# Patient Record
Sex: Male | Born: 1980
Health system: Southern US, Community
[De-identification: ages and names within clinical notes are randomized; demographics above are authoritative.]

---

## 2013-12-21 ENCOUNTER — Encounter: Payer: Self-pay | Admitting: Family Medicine

## 2013-12-21 ENCOUNTER — Encounter (INDEPENDENT_AMBULATORY_CARE_PROVIDER_SITE_OTHER): Payer: Self-pay

## 2013-12-21 ENCOUNTER — Ambulatory Visit (INDEPENDENT_AMBULATORY_CARE_PROVIDER_SITE_OTHER): Payer: Commercial Managed Care - PPO | Admitting: Family Medicine

## 2013-12-21 VITALS — BP 111/73 | HR 79 | Temp 97.3°F | Ht 66.0 in | Wt 259.8 lb

## 2013-12-21 DIAGNOSIS — L237 Allergic contact dermatitis due to plants, except food: Secondary | ICD-10-CM

## 2013-12-21 DIAGNOSIS — L255 Unspecified contact dermatitis due to plants, except food: Secondary | ICD-10-CM

## 2013-12-21 MED ORDER — METHYLPREDNISOLONE ACETATE 80 MG/ML IJ SUSP
80.0000 mg | Freq: Once | INTRAMUSCULAR | Status: AC
  Administered 2013-12-21: 80 mg via INTRAMUSCULAR

## 2013-12-21 MED ORDER — METHYLPREDNISOLONE (PAK) 4 MG PO TABS: ORAL_TABLET | ORAL | Status: DC

## 2013-12-21 NOTE — Progress Notes (Signed)
Subjective:    Patient ID: Stephen Johnson, male    DOB: [birth year], 33 y.o.   MRN: [id number]  HPI  This 33 y.o. male presents for evaluation of poison ivy rash on his arms and his hands for a week.  Review of Systems C/o rash No chest pain, SOB, HA, dizziness, vision change, N/V, diarrhea, constipation, dysuria, urinary urgency or frequency, myalgias, arthralgias.     Objective:   Physical Exam  Vital signs noted  Well developed well nourished male.  HEENT - Head atraumatic Normocephalic                Eyes - PERRLA, Conjuctiva - clear Sclera- Clear EOMI                Ears - EAC's Wnl TM's Wnl Gross Hearing WNL                Throat - oropharanx wnl Respiratory - Lungs CTA bilateral Cardiac - RRR S1 and S2 without murmur GI - Abdomen soft Nontender and bowel sounds active x 4 Extremities - No edema. Neuro - Grossly intact. Skin - Erythematous rash on hands and arms bilateral.     Assessment & Plan:  Poison oak dermatitis - Plan: methylPREDNISolone acetate (DEPO-MEDROL) injection 80 mg, methylPREDNIsolone (MEDROL DOSPACK) 4 MG tablet. Discussed using aveeno bath, and benadryl otc prn  Deatra Canter FNP

## 2013-12-21 NOTE — Patient Instructions (Signed)

## 2014-02-06 ENCOUNTER — Ambulatory Visit: Payer: Commercial Managed Care - PPO

## 2014-06-17 ENCOUNTER — Ambulatory Visit: Payer: Commercial Managed Care - PPO | Admitting: Family Medicine

## 2015-08-12 ENCOUNTER — Telehealth: Payer: Self-pay | Admitting: Family Medicine

## 2015-08-12 NOTE — Telephone Encounter (Signed)
Stp and refused to see anyone but [facility] and pt was advised that [facility] is only seeing his chronic follow up pts due to the fact that he will be going to part time in January. Pt states he will go somewhere else. Pt had not been seen in office in over a year and that was with Ander Slade not [facility].

## 2015-09-30 ENCOUNTER — Telehealth: Payer: Self-pay | Admitting: Family Medicine

## 2015-09-30 NOTE — Telephone Encounter (Signed)
Appointment made with Dr. Ermalinda Memos for Thursday at 11:10 for an annual physical.  Needs referral to rheumatology

## 2015-10-02 ENCOUNTER — Encounter: Payer: Self-pay | Admitting: Family Medicine

## 2015-10-02 ENCOUNTER — Ambulatory Visit (INDEPENDENT_AMBULATORY_CARE_PROVIDER_SITE_OTHER): Payer: 59 | Admitting: Family Medicine

## 2015-10-02 VITALS — BP 117/80 | HR 64 | Temp 97.1°F | Ht 66.0 in | Wt 263.4 lb

## 2015-10-02 DIAGNOSIS — Z0000 Encounter for general adult medical examination without abnormal findings: Secondary | ICD-10-CM | POA: Diagnosis not present

## 2015-10-02 DIAGNOSIS — M109 Gout, unspecified: Secondary | ICD-10-CM | POA: Diagnosis not present

## 2015-10-02 NOTE — Progress Notes (Signed)
   HPI  Patient presents today here for referral to rheumatologist an annual physical.   Gout He states that about a month ago he a red painful right ankle and was seen at a podiatrist. Podiatrist check his uric acid level which was 4 and gave him cortisone injections which helped his pain a lot. He told that he had gout. He is a friend who works at [id number] rheumatology and would like to be referred there for gout management. He works as an Public relations account executive.  He does not exercise, he does not watch his diet. He is a nonsmoker  PMH: Smoking status noted Past medical, surgical, family, and social history reviewed and updated in EMR ROS: Per HPI  Objective: BP 117/80 mmHg  Pulse 64  Temp(Src) 97.1 F (36.2 C) (Oral)  Ht $R'5\' 6"'QO$  (1.676 m)  Wt 263 lb 6.4 oz (119.477 kg)  BMI 42.53 kg/m2 Gen: NAD, alert, cooperative with exam HEENT: NCAT, sclera white, TMs normal bilaterally, nares clear, oropharynx clear CV: RRR, good S1/S2, no murmur Resp: CTABL, no wheezes, non-labored Abd: SNTND, BS present, no guarding or organomegaly Ext: No edema, warm Neuro: Alert and oriented, No gross deficits  Assessment and plan:  # Gout Refer to rheumatology per his request Discussed usual treatment, also checking uric acid per his request Return to clinic if needed for gout flares  # Annual physical Discussed positive last changes Fasting labs He has had his flu shot   Orders Placed This Encounter  Procedures  . CMP14+EGFR  . CBC  . Lipid Panel  . Uric acid  . Ambulatory referral to Rheumatology    Referral Priority:  Routine    Referral Type:  Consultation    Referral Reason:  Specialty Services Required    Requested Specialty:  Rheumatology    Number of Visits Requested:  [place], MD [facility] 10/02/2015, 11:48 AM

## 2015-10-02 NOTE — Patient Instructions (Signed)
Great to meet you! We will have your labs back to you within 1 week.   We will also work on your referral.   Gout Gout is when your joints become red, sore, and swell (inflamed). This is caused by the buildup of uric acid crystals in the joints. Uric acid is a chemical that is normally in the blood. If the level of uric acid gets too high in the blood, these crystals form in your joints and tissues. Over time, these crystals can form into masses near the joints and tissues. These masses can destroy bone and cause the bone to look misshapen (deformed). HOME CARE   Do not take aspirin for pain.  Only take medicine as told by your doctor.  Rest the joint as much as you can. When in bed, keep sheets and blankets off painful areas.  Keep the sore joints raised (elevated).  Put warm or cold packs on painful joints. Use of warm or cold packs depends on which works best for you.  Use crutches if the painful joint is in your leg.  Drink enough fluids to keep your pee (urine) clear or pale yellow. Limit alcohol, sugary drinks, and drinks with fructose in them.  Follow your diet instructions. Pay careful attention to how much protein you eat. Include fruits, vegetables, whole grains, and fat-free or low-fat milk products in your daily diet. Talk to your doctor or dietitian about the use of coffee, vitamin C, and cherries. These may help lower uric acid levels.  Keep a healthy body weight. GET HELP RIGHT AWAY IF:   You have watery poop (diarrhea), throw up (vomit), or have any side effects from medicines.  You do not feel better in 24 hours, or you are getting worse.  Your joint becomes suddenly more tender, and you have chills or a fever. MAKE SURE YOU:   Understand these instructions.  Will watch your condition.  Will get help right away if you are not doing well or get worse.   This information is not intended to replace advice given to you by your health care provider. Make sure you  discuss any questions you have with your health care provider.   Document Released: 09/14/2008 Document Revised: 12/27/2014 Document Reviewed: 07/19/2012 Elsevier Interactive Patient Education [employer]2016 Elsevier Inc.

## 2015-10-03 LAB — CMP14+EGFR
ALBUMIN: 4.8 g/dL (ref 3.5–5.5)
ALK PHOS: 105 IU/L (ref 39–117)
ALT: 52 IU/L — ABNORMAL HIGH (ref 0–44)
AST: 32 IU/L (ref 0–40)
Albumin/Globulin Ratio: 1.9 (ref 1.1–2.5)
BUN / CREAT RATIO: 10 (ref 8–19)
BUN: 11 mg/dL (ref 6–20)
Bilirubin Total: 0.5 mg/dL (ref 0.0–1.2)
CO2: 23 mmol/L (ref 18–29)
CREATININE: 1.1 mg/dL (ref 0.76–1.27)
Calcium: 9.5 mg/dL (ref 8.7–10.2)
Chloride: 101 mmol/L (ref 97–108)
GFR calc non Af Amer: 87 mL/min/{1.73_m2} (ref >59)
GFR, EST AFRICAN AMERICAN: 101 mL/min/{1.73_m2} (ref >59)
GLOBULIN, TOTAL: 2.5 g/dL (ref 1.5–4.5)
Glucose: 95 mg/dL (ref 65–99)
Potassium: 4.5 mmol/L (ref 3.5–5.2)
SODIUM: 141 mmol/L (ref 134–144)
TOTAL PROTEIN: 7.3 g/dL (ref 6.0–8.5)

## 2015-10-03 LAB — CBC
Hematocrit: 45.2 % (ref 37.5–51.0)
Hemoglobin: 15.8 g/dL (ref 12.6–17.7)
MCH: 30.5 pg (ref 26.6–33.0)
MCHC: 35 g/dL (ref 31.5–35.7)
MCV: 87 fL (ref 79–97)
PLATELETS: 220 10*3/uL (ref 150–379)
RBC: 5.18 x10E6/uL (ref 4.14–5.80)
RDW: 14.3 % (ref 12.3–15.4)
WBC: 7.6 10*3/uL (ref 3.4–10.8)

## 2015-10-03 LAB — LIPID PANEL
Chol/HDL Ratio: 5.3 ratio units — ABNORMAL HIGH (ref 0.0–5.0)
Cholesterol, Total: 185 mg/dL (ref 100–199)
HDL: 35 mg/dL — ABNORMAL LOW (ref >39)
LDL Calculated: 115 mg/dL — ABNORMAL HIGH (ref 0–99)
Triglycerides: 173 mg/dL — ABNORMAL HIGH (ref 0–149)
VLDL Cholesterol Cal: 35 mg/dL (ref 5–40)

## 2015-10-03 LAB — URIC ACID: URIC ACID: 8.8 mg/dL — AB (ref 3.7–8.6)

## 2016-03-11 ENCOUNTER — Telehealth: Payer: 59 | Admitting: Physician Assistant

## 2016-03-11 DIAGNOSIS — B9689 Other specified bacterial agents as the cause of diseases classified elsewhere: Secondary | ICD-10-CM

## 2016-03-11 DIAGNOSIS — J019 Acute sinusitis, unspecified: Secondary | ICD-10-CM | POA: Diagnosis not present

## 2016-03-11 MED ORDER — DOXYCYCLINE HYCLATE 100 MG PO CAPS: 100.0000 mg | ORAL_CAPSULE | Freq: Two times a day (BID) | ORAL | Status: DC

## 2016-03-11 MED FILL — DOXYCYCLINE HYCLATE 100 MG: 100 | 10 days supply | Qty: 20 | Fill #0

## 2016-03-11 NOTE — Progress Notes (Signed)

## 2016-05-13 ENCOUNTER — Telehealth: Payer: 59 | Admitting: Physician Assistant

## 2016-05-13 DIAGNOSIS — L255 Unspecified contact dermatitis due to plants, except food: Secondary | ICD-10-CM | POA: Diagnosis not present

## 2016-05-13 MED ORDER — PREDNISONE 10 MG (21) PO TBPK: 10.0000 mg | ORAL_TABLET | Freq: Every day | ORAL | Status: DC

## 2016-05-13 MED FILL — predniSONE 10 MG TABS: 10 | 6 days supply | Qty: 21 | Fill #0

## 2016-05-13 NOTE — Progress Notes (Signed)

## 2016-06-30 ENCOUNTER — Emergency Department (HOSPITAL_COMMUNITY): Payer: 59

## 2016-06-30 ENCOUNTER — Emergency Department (HOSPITAL_COMMUNITY)
Admission: EM | Admit: 2016-06-30 | Discharge: 2016-06-30 | Disposition: A | Discharge status: Home / Self Care | Payer: 59 | Attending: Emergency Medicine | Admitting: Emergency Medicine

## 2016-06-30 ENCOUNTER — Encounter (HOSPITAL_COMMUNITY): Payer: Self-pay | Admitting: Nurse Practitioner

## 2016-06-30 DIAGNOSIS — N1330 Unspecified hydronephrosis: Secondary | ICD-10-CM | POA: Diagnosis not present

## 2016-06-30 DIAGNOSIS — N132 Hydronephrosis with renal and ureteral calculous obstruction: Secondary | ICD-10-CM | POA: Insufficient documentation

## 2016-06-30 DIAGNOSIS — R0602 Shortness of breath: Secondary | ICD-10-CM | POA: Diagnosis not present

## 2016-06-30 DIAGNOSIS — N200 Calculus of kidney: Secondary | ICD-10-CM

## 2016-06-30 DIAGNOSIS — R109 Unspecified abdominal pain: Secondary | ICD-10-CM | POA: Diagnosis not present

## 2016-06-30 DIAGNOSIS — N202 Calculus of kidney with calculus of ureter: Secondary | ICD-10-CM | POA: Diagnosis not present

## 2016-06-30 DIAGNOSIS — R1011 Right upper quadrant pain: Secondary | ICD-10-CM | POA: Diagnosis present

## 2016-06-30 LAB — COMPREHENSIVE METABOLIC PANEL
ALBUMIN: 4.6 g/dL (ref 3.5–5.0)
ALT: 70 U/L — ABNORMAL HIGH (ref 17–63)
AST: 46 U/L — AB (ref 15–41)
Alkaline Phosphatase: 91 U/L (ref 38–126)
Anion gap: 11 (ref 5–15)
BUN: 9 mg/dL (ref 6–20)
CHLORIDE: 106 mmol/L (ref 101–111)
CO2: 20 mmol/L — AB (ref 22–32)
Calcium: 9.3 mg/dL (ref 8.9–10.3)
Creatinine, Ser: 1.38 mg/dL — ABNORMAL HIGH (ref 0.61–1.24)
GFR calc Af Amer: >60 mL/min (ref >60)
GFR calc non Af Amer: >60 mL/min (ref >60)
GLUCOSE: 123 mg/dL — AB (ref 65–99)
POTASSIUM: 3.4 mmol/L — AB (ref 3.5–5.1)
Sodium: 137 mmol/L (ref 135–145)
Total Bilirubin: 1 mg/dL (ref 0.3–1.2)
Total Protein: 7.4 g/dL (ref 6.5–8.1)

## 2016-06-30 LAB — URINALYSIS, ROUTINE W REFLEX MICROSCOPIC
Bilirubin Urine: NEGATIVE
GLUCOSE, UA: NEGATIVE mg/dL
Hgb urine dipstick: NEGATIVE
KETONES UR: NEGATIVE mg/dL
LEUKOCYTES UA: NEGATIVE
Nitrite: NEGATIVE
PH: 5.5 (ref 5.0–8.0)
Protein, ur: NEGATIVE mg/dL
Specific Gravity, Urine: 1.023 (ref 1.005–1.030)

## 2016-06-30 LAB — CBC
HEMATOCRIT: 45.6 % (ref 39.0–52.0)
Hemoglobin: 16.2 g/dL (ref 13.0–17.0)
MCH: 30.2 pg (ref 26.0–34.0)
MCHC: 35.5 g/dL (ref 30.0–36.0)
MCV: 85.1 fL (ref 78.0–100.0)
Platelets: 282 10*3/uL (ref 150–400)
RBC: 5.36 MIL/uL (ref 4.22–5.81)
RDW: 13.1 % (ref 11.5–15.5)
WBC: 11.3 10*3/uL — AB (ref 4.0–10.5)

## 2016-06-30 LAB — LIPASE, BLOOD: Lipase: 28 U/L (ref 11–51)

## 2016-06-30 MED ORDER — PROMETHAZINE HCL 25 MG/ML IJ SOLN
25.0000 mg | Freq: Once | INTRAMUSCULAR | Status: AC
  Administered 2016-06-30: 25 mg via INTRAVENOUS
  Filled 2016-06-30: qty 1

## 2016-06-30 MED ORDER — FENTANYL CITRATE (PF) 100 MCG/2ML IJ SOLN
INTRAMUSCULAR | Status: AC
  Filled 2016-06-30: qty 2

## 2016-06-30 MED ORDER — ONDANSETRON 4 MG PO TBDP
4.0000 mg | ORAL_TABLET | Freq: Once | ORAL | Status: AC | PRN
  Administered 2016-06-30: 4 mg via ORAL
  Filled 2016-06-30: qty 1

## 2016-06-30 MED ORDER — HYDROMORPHONE HCL 1 MG/ML IJ SOLN
1.0000 mg | Freq: Once | INTRAMUSCULAR | Status: DC
  Filled 2016-06-30: qty 1

## 2016-06-30 MED ORDER — HYDROMORPHONE HCL 1 MG/ML IJ SOLN
INTRAMUSCULAR | Status: AC
  Administered 2016-06-30: 1 mg via INTRAVENOUS
  Filled 2016-06-30: qty 1

## 2016-06-30 MED ORDER — KETOROLAC TROMETHAMINE 15 MG/ML IJ SOLN
15.0000 mg | Freq: Once | INTRAMUSCULAR | Status: AC
  Administered 2016-06-30: 15 mg via INTRAVENOUS
  Filled 2016-06-30: qty 1

## 2016-06-30 MED ORDER — HYDROMORPHONE HCL 1 MG/ML IJ SOLN
1.0000 mg | Freq: Once | INTRAMUSCULAR | Status: AC
  Administered 2016-06-30: 1 mg via INTRAVENOUS

## 2016-06-30 MED ORDER — FENTANYL CITRATE (PF) 100 MCG/2ML IJ SOLN
50.0000 ug | INTRAMUSCULAR | Status: AC | PRN
  Administered 2016-06-30 (×2): 50 ug via INTRAVENOUS

## 2016-06-30 MED ORDER — HYDROCODONE-ACETAMINOPHEN 5-325 MG PO TABS: 2.0000 | ORAL_TABLET | ORAL | Status: DC | PRN

## 2016-06-30 MED ORDER — HYDROMORPHONE HCL 1 MG/ML IJ SOLN
1.0000 mg | Freq: Once | INTRAMUSCULAR | Status: DC
  Administered 2016-06-30: 1 mg via INTRAVENOUS

## 2016-06-30 MED ORDER — HYDROMORPHONE HCL 1 MG/ML IJ SOLN: 1.0000 mg | Freq: Once | INTRAMUSCULAR | Status: DC

## 2016-06-30 MED ORDER — NAPROXEN 500 MG PO TABS: 500.0000 mg | ORAL_TABLET | Freq: Two times a day (BID) | ORAL | Status: DC

## 2016-06-30 MED ORDER — PROMETHAZINE HCL 25 MG PO TABS: 25.0000 mg | ORAL_TABLET | Freq: Four times a day (QID) | ORAL | Status: DC | PRN

## 2016-06-30 MED ORDER — TAMSULOSIN HCL 0.4 MG PO CAPS: 0.4000 mg | ORAL_CAPSULE | Freq: Every day | ORAL | Status: DC

## 2016-06-30 MED ORDER — MORPHINE SULFATE (PF) 10 MG/ML IV SOLN
10.0000 mg | Freq: Once | INTRAVENOUS | Status: AC
  Administered 2016-06-30: 10 mg via INTRAVENOUS
  Filled 2016-06-30: qty 1

## 2016-06-30 MED ORDER — FENTANYL CITRATE (PF) 100 MCG/2ML IJ SOLN
INTRAMUSCULAR | Status: AC
  Administered 2016-06-30: 50 ug via INTRAVENOUS
  Filled 2016-06-30: qty 2

## 2016-06-30 MED ORDER — ONDANSETRON HCL 4 MG/2ML IJ SOLN
4.0000 mg | Freq: Once | INTRAMUSCULAR | Status: AC
  Administered 2016-06-30: 4 mg via INTRAVENOUS
  Filled 2016-06-30: qty 2

## 2016-06-30 NOTE — ED Provider Notes (Signed)
CSN: [id number]     Arrival date & time 06/30/16  1658 History   First MD Initiated Contact with Patient 06/30/16 1742     Chief Complaint  Patient presents with  . Abdominal Pain     (Consider location/radiation/quality/duration/timing/severity/associated sxs/prior Treatment) HPI Aching abd pain for 2 days then became sharp, severe in last hr.  Right upper quadrant, right side pain.  Nothing makes it better or worse.  Nausea, vomiting from pain. No other symptoms.  History reviewed. No pertinent past medical history. History reviewed. No pertinent past surgical history. History reviewed. No pertinent family history. Social History  Substance Use Topics  . Smoking status: Never Smoker   . Smokeless tobacco: None  . Alcohol Use: No    Review of Systems  Constitutional: Negative for fever.  HENT: Negative for sore throat.   Eyes: Negative for visual disturbance.  Respiratory: Negative for shortness of breath.   Cardiovascular: Negative for chest pain.  Gastrointestinal: Positive for nausea, vomiting and abdominal pain. Negative for diarrhea and constipation.  Genitourinary: Positive for flank pain. Negative for difficulty urinating.  Musculoskeletal: Negative for back pain and neck stiffness.  Skin: Negative for rash.  Neurological: Negative for syncope and headaches.      Allergies  Penicillins  Home Medications   Prior to Admission medications   Medication Sig Start Date End Date Taking? Authorizing Provider  doxycycline (VIBRAMYCIN) 100 MG capsule Take 1 capsule (100 mg total) by mouth 2 (two) times daily. Patient not taking: Reported on 06/30/2016 03/11/16   Waldon Merl, PA-C  HYDROcodone-acetaminophen (NORCO/VICODIN) 5-325 MG tablet Take 2 tablets by mouth every 4 (four) hours as needed. 06/30/16   Alvira Monday, MD  naproxen (NAPROSYN) 500 MG tablet Take 1 tablet (500 mg total) by mouth 2 (two) times daily. 06/30/16   Alvira Monday, MD  predniSONE (STERAPRED  UNI-PAK 21 TAB) 10 MG (21) TBPK tablet Take 1 tablet (10 mg total) by mouth daily. Take following package directions. Patient not taking: Reported on 06/30/2016 05/13/16   Waldon Merl, PA-C  promethazine (PHENERGAN) 25 MG tablet Take 1 tablet (25 mg total) by mouth every 6 (six) hours as needed for nausea or vomiting. 06/30/16   Alvira Monday, MD  tamsulosin (FLOMAX) 0.4 MG CAPS capsule Take 1 capsule (0.4 mg total) by mouth daily. 06/30/16   Alvira Monday, MD   BP 141/96 mmHg  Pulse 81  Temp(Src) 98.6 F (37 C) (Oral)  Resp 20  SpO2 93% Physical Exam  Constitutional: He is oriented to person, place, and time. He appears well-developed and well-nourished. No distress.  HENT:  Head: Normocephalic and atraumatic.  Eyes: Conjunctivae and EOM are normal.  Neck: Normal range of motion.  Cardiovascular: Normal rate, regular rhythm, normal heart sounds and intact distal pulses.  Exam reveals no gallop and no friction rub.   No murmur heard. Pulmonary/Chest: Effort normal and breath sounds normal. No respiratory distress. He has no wheezes. He has no rales.  Abdominal: Soft. He exhibits no distension. There is tenderness (RUQ). There is guarding (RUQ), CVA tenderness (R) and positive Murphy's sign.  Musculoskeletal: He exhibits no edema.  Neurological: He is alert and oriented to person, place, and time.  Skin: Skin is warm and dry. He is not diaphoretic.  Nursing note and vitals reviewed.   ED Course  Procedures (including critical care time) Labs Review Labs Reviewed  COMPREHENSIVE METABOLIC PANEL - Abnormal; Notable for the following:    Potassium 3.4 (*)  CO2 20 (*)    Glucose, Bld 123 (*)    Creatinine, Ser 1.38 (*)    AST 46 (*)    ALT 70 (*)    All other components within normal limits  CBC - Abnormal; Notable for the following:    WBC 11.3 (*)    All other components within normal limits  URINALYSIS, ROUTINE W REFLEX MICROSCOPIC (NOT AT [facility])  LIPASE, BLOOD     Imaging Review Dg Chest Portable 1 View  06/30/2016  CLINICAL DATA:  Generalized abdominal pain and shortness of breath for 4 days. EXAM: PORTABLE CHEST 1 VIEW COMPARISON:  No comparison studies available. FINDINGS: 1928 hours. Low lung volumes. The lungs are clear wiithout focal pneumonia, edema, pneumothorax or pleural effusion. Cardiopericardial silhouette is at upper limits of normal for size. The visualized bony structures of the thorax are intact. IMPRESSION: No active disease. Electronically Signed   By: Kennith Center M.D.   On: 06/30/2016 19:44   Dg Abd Portable 1v  06/30/2016  CLINICAL DATA:  Abdominal pain and shortness of breath for 4 days. Increased symptoms over the last several hours. EXAM: PORTABLE ABDOMEN - 1 VIEW COMPARISON:  None. FINDINGS: The bowel gas pattern is normal. No radio-opaque calculi or other significant radiographic abnormality are seen. IMPRESSION: Negative two view abdomen. Electronically Signed   By: Marin Roberts M.D.   On: 06/30/2016 19:44   Ct Renal Stone Study  06/30/2016  CLINICAL DATA:  Right-sided flank and right lower quadrant pain for 2 days. EXAM: CT ABDOMEN AND PELVIS WITHOUT CONTRAST TECHNIQUE: Multidetector CT imaging of the abdomen and pelvis was performed following the standard protocol without IV contrast. COMPARISON:  Abdominal radiographs from the same day. FINDINGS: Lower chest: Mild dependent atelectasis is present at the lung bases bilaterally without other focal airspace disease, nodule, or mass lesion. The heart size is normal. No significant pleural or pericardial effusion is present. Hepatobiliary: There is diffuse fatty infiltration of the liver. No significant nodularity is present. No mass lesion is present. The common bile duct and gallbladder are within normal limits. Pancreas: The pancreas is unremarkable. Spleen: The spleen is within normal limits. Adrenals/Urinary Tract: Adrenal glands are normal bilaterally. Mild right-sided  hydronephrosis is present. The right ureter is dilated into the level of the right UVJ where a 2.5 mm stone is obstructing. The left kidney and ureter are within normal limits. No additional stones are present. The urinary bladder is within normal limits otherwise. Stomach/Bowel: The stomach and duodenum are within normal limits. Small bowel is unremarkable. The appendix is visualized and normal. The ascending and transverse colon are within normal limits. The descending and rectosigmoid colon are unremarkable. Vascular/Lymphatic: No significant vascular calcifications are present. There is no significant adenopathy. Reproductive: Unremarkable Other: No significant free fluid is present. There are no significant hernias. Musculoskeletal: Bone windows demonstrate no focal lytic or blastic lesions. Vertebral body heights and alignment are maintained. IMPRESSION: 1. Mild right-sided hydronephrosis and ureteral dilation with an obstructing 2.5 mm stone at the right UVJ. 2. No other stones or left-sided hydronephrosis. 3. Hepatic steatosis. Electronically Signed   By: Marin Roberts M.D.   On: 06/30/2016 20:55   I have personally reviewed and evaluated these images and lab results as part of my medical decision-making.   EKG Interpretation None      MDM   Final diagnoses:  Right nephrolithiasis  Hydronephrosis, right   35yo male with no significant medical history presents with concern for right upper quadrant  and flank pain. Patient with intractable pain on arrival, with ddx including nephrolithiasis, cholecystitis, perforation. Portable XR without abnormalities.  Given fentanyl, dilaudid, morphine, phenergan with improvement of symptoms. CT stone study shows 2.665mm obstructing nephrolithiasis.  Given toradol. Discharged with rx for flomax, norco, naproxen and phenergan. Patient discharged in stable condition with understanding of reasons to return.   Alvira MondayErin Elvin Mccartin, MD 07/01/16 1819

## 2016-06-30 NOTE — ED Notes (Signed)
Contacted CT in order to get pt in quickly now that he's more settled.

## 2016-06-30 NOTE — ED Notes (Signed)
Pt departed in NAD.  

## 2016-06-30 NOTE — ED Notes (Signed)
MD made aware of pt. Persistent pain, ok to give 50 mcg fentanyl verbal order.

## 2016-06-30 NOTE — ED Notes (Signed)
Ashleigh, RN went to speak with MD about pain medicine for patient, will attempt bp again; patient still pacing

## 2016-06-30 NOTE — ED Notes (Signed)
Pt c/o 2 day history dull RLQ abd pain. In past hour developed severe pain, diaphoretic, n/v, Pt had to pull his car over and call family for ride. Pt is unable to sit still, remains diaphoretic

## 2016-06-30 NOTE — ED Notes (Signed)
Patient transported to CT 

## 2016-07-31 IMAGING — CR DG CHEST 1V PORT
1 series · 1 of 1 positions shown · non-contrast
Comparison: No comparison studies available.

CLINICAL DATA: Generalized abdominal pain and shortness of breath
for 4 days.

EXAM:
PORTABLE CHEST 1 VIEW

[AP]
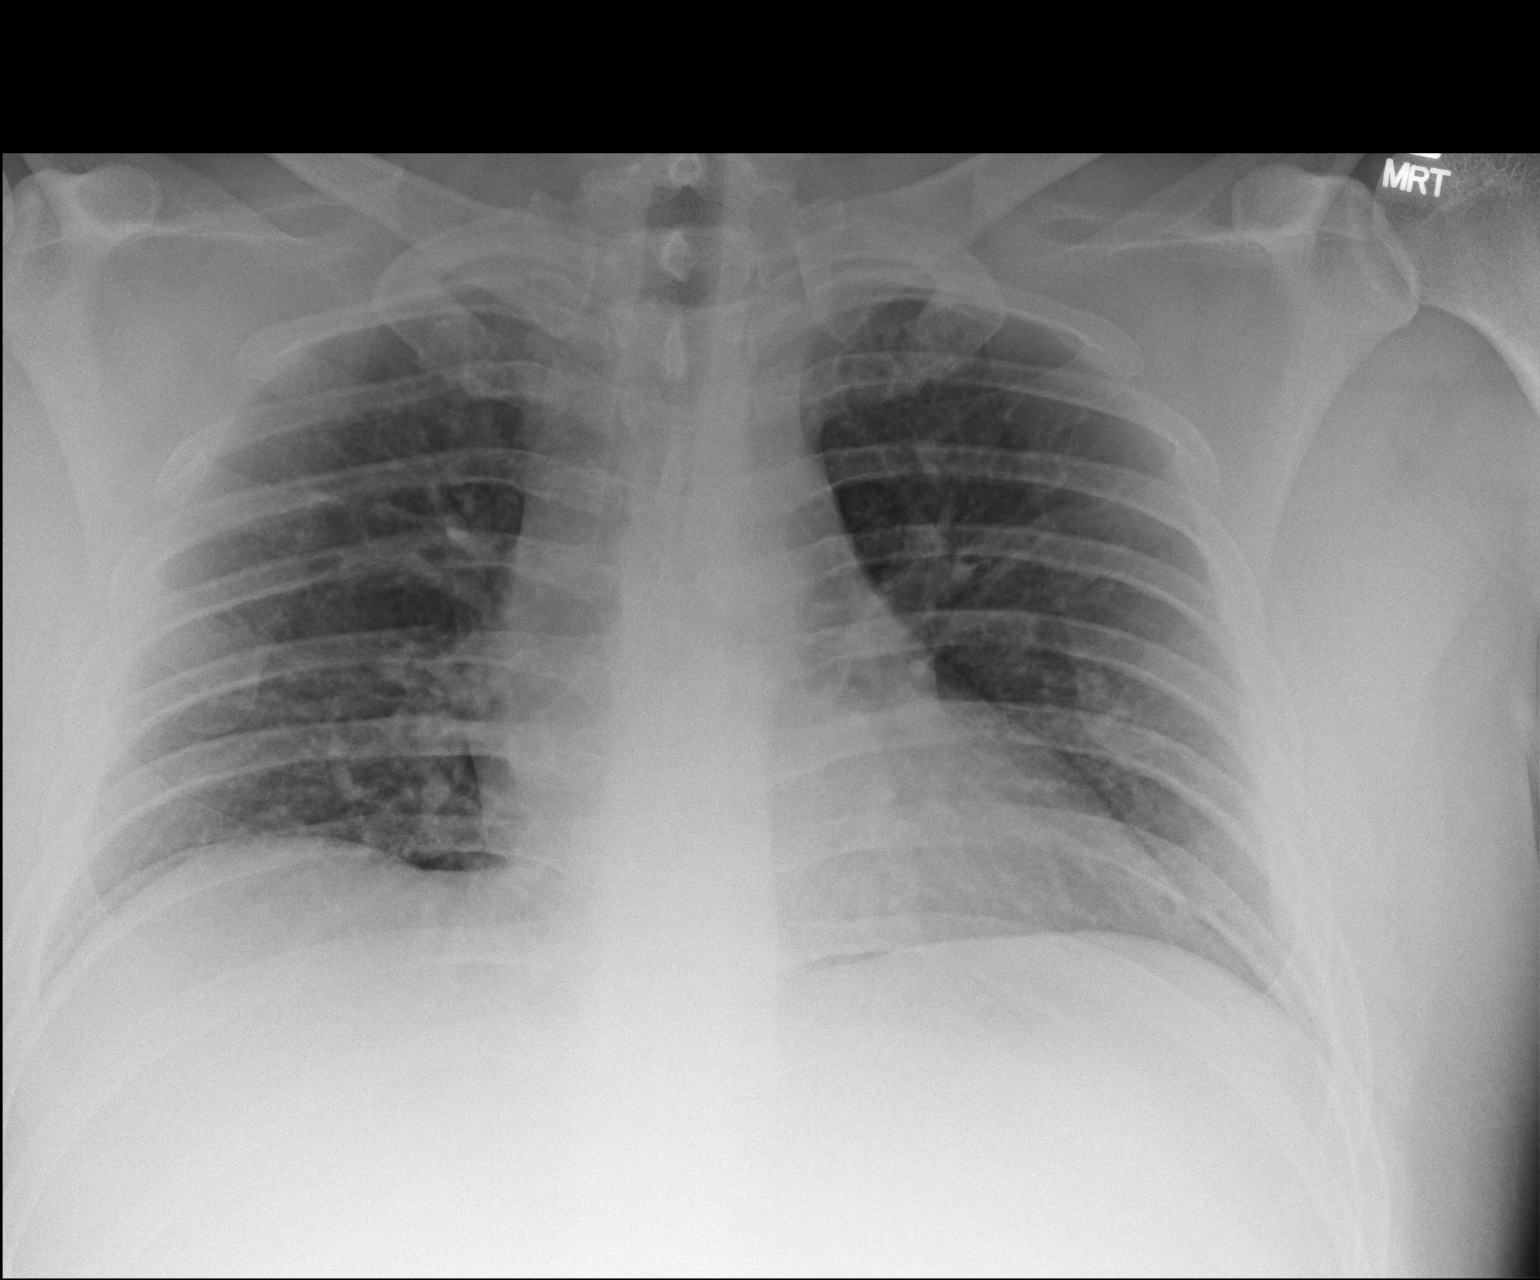

[1 of 1 positions shown; findings below may reference images not displayed]

FINDINGS: 2620 hours. Low lung volumes. The lungs are clear wiithout focal
pneumonia, edema, pneumothorax or pleural effusion.
Cardiopericardial silhouette is at upper limits of normal for size.
The visualized bony structures of the thorax are intact.
IMPRESSION: No active disease.

## 2016-07-31 IMAGING — CR DG ABD PORTABLE 1V
2 series · 2 of 2 positions shown · non-contrast
Comparison: None.

CLINICAL DATA: Abdominal pain and shortness of breath for 4 days.
Increased symptoms over the last several hours.

EXAM:
PORTABLE ABDOMEN - 1 VIEW

[AP (1 of 2)]
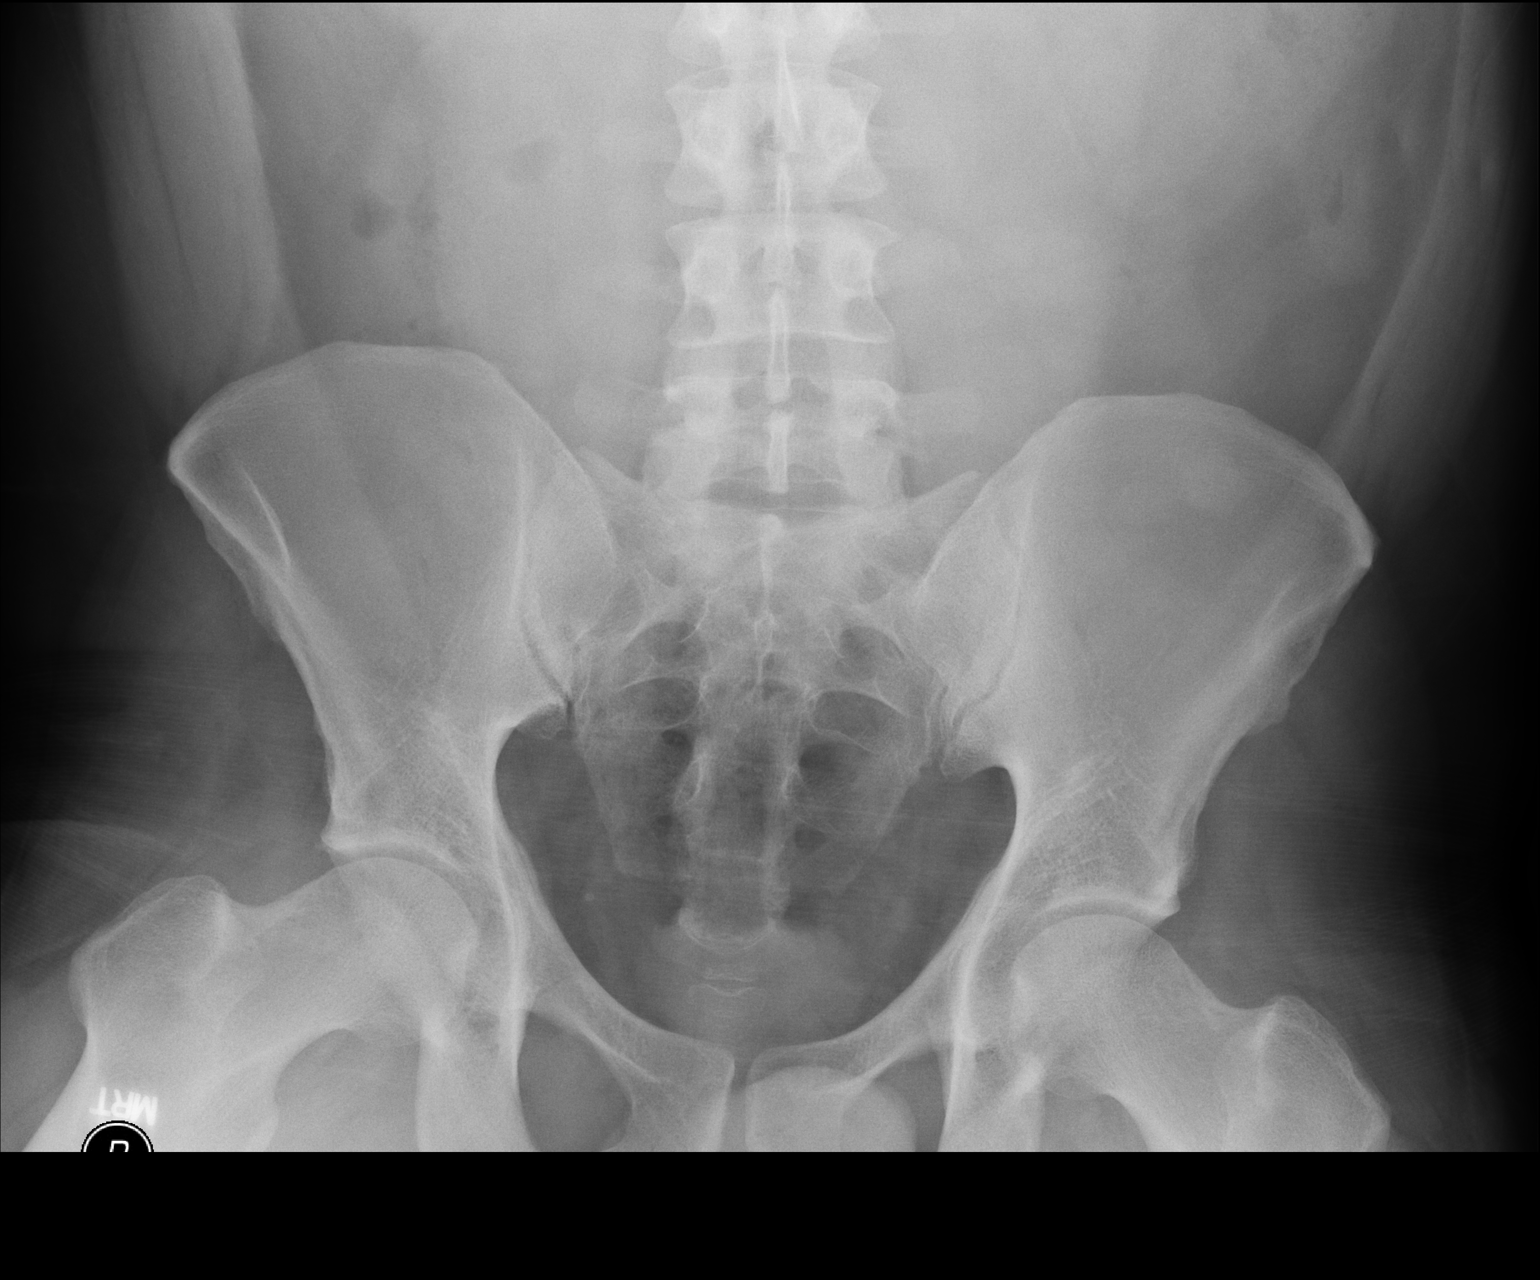

[AP (2 of 2)]
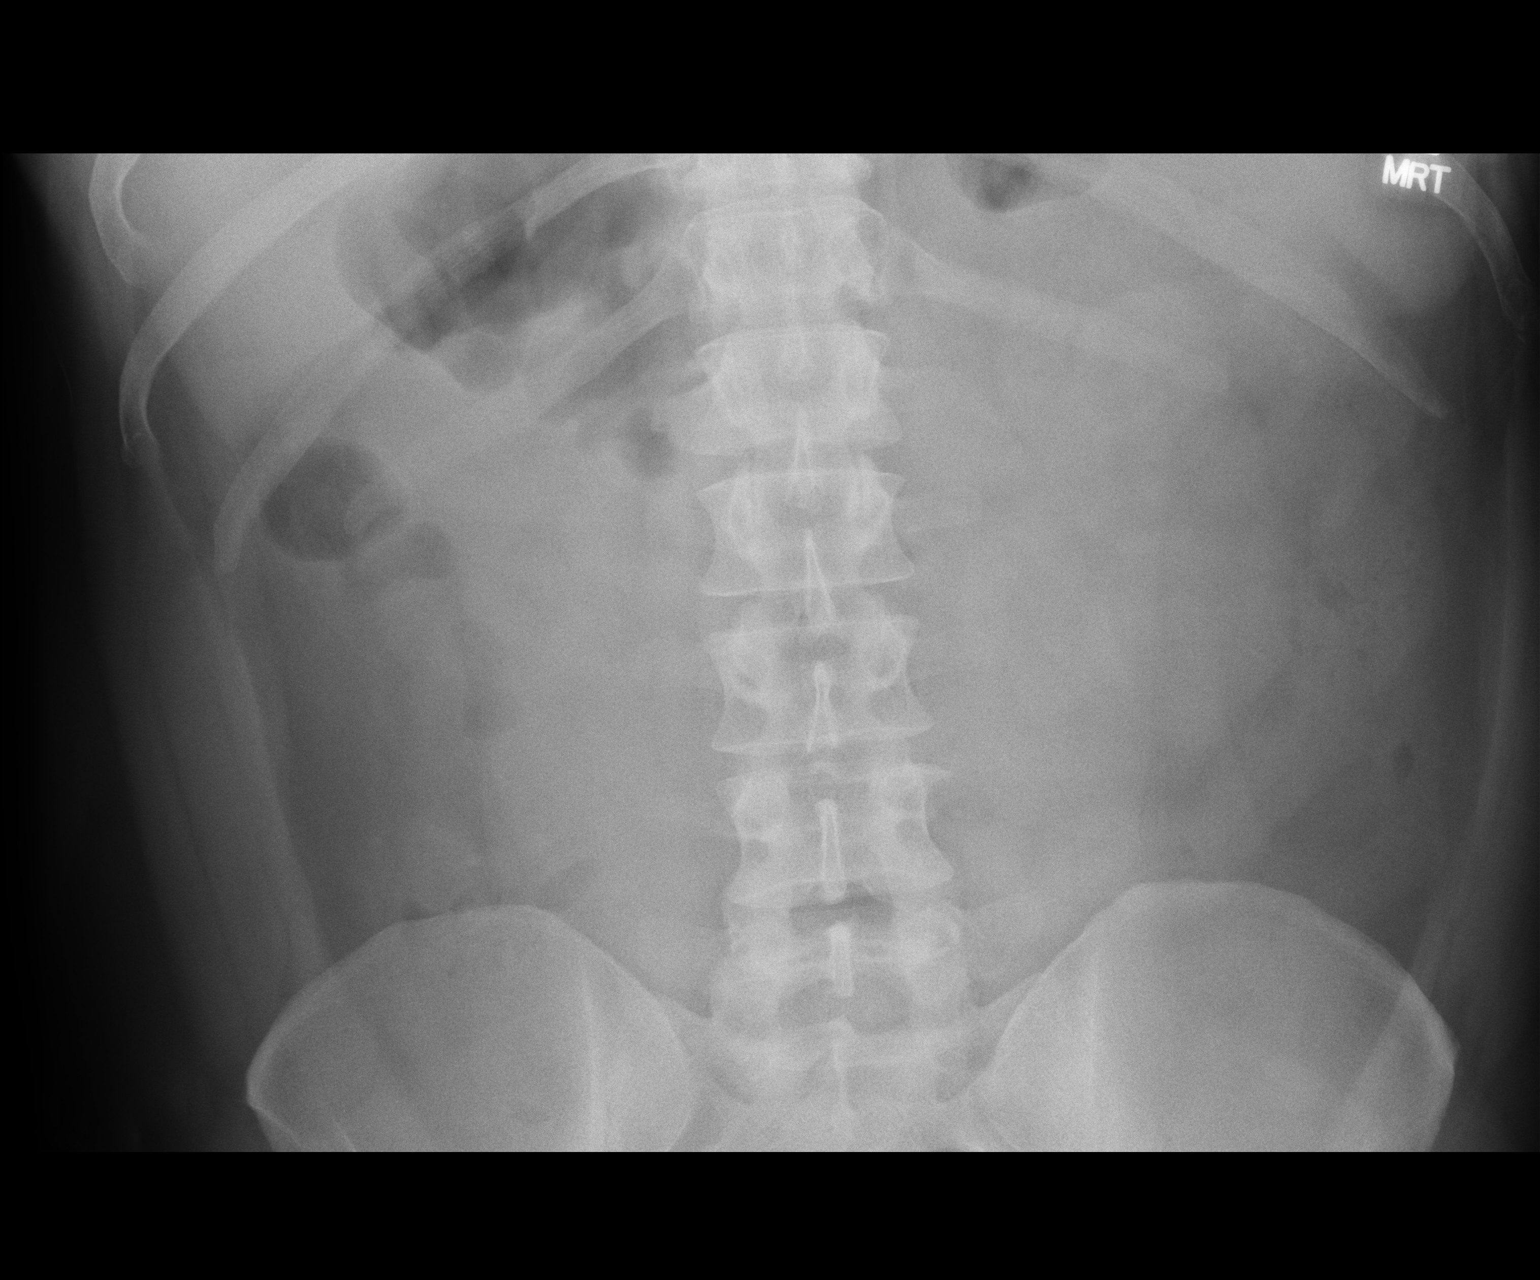

[2 of 2 positions shown; findings below may reference images not displayed]

FINDINGS: The bowel gas pattern is normal. No radio-opaque calculi or other
significant radiographic abnormality are seen.
IMPRESSION: Negative two view abdomen.

## 2016-07-31 IMAGING — CT CT RENAL STONE PROTOCOL
2 of 4 series · 16 of 46 positions shown, 18 images · non-contrast
Comparison: Abdominal radiographs from the same day.

CLINICAL DATA: Right-sided flank and right lower quadrant pain for
2 days.

EXAM:
CT ABDOMEN AND PELVIS WITHOUT CONTRAST
TECHNIQUE: Multidetector CT imaging of the abdomen and pelvis was performed
following the standard protocol without IV contrast.

[Series 2: renal stone 5mm · axial · 0.81mm/px · z∈[+854,+1359]mm · 13 of 111 slices shown, 15 images]
[im 5/111  soft-tissue]
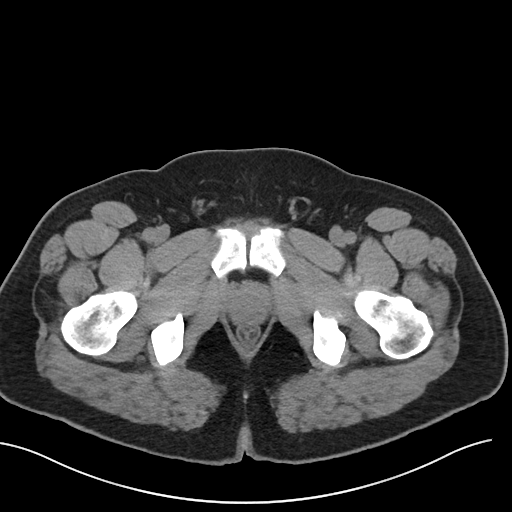
[im 5/111  bone]
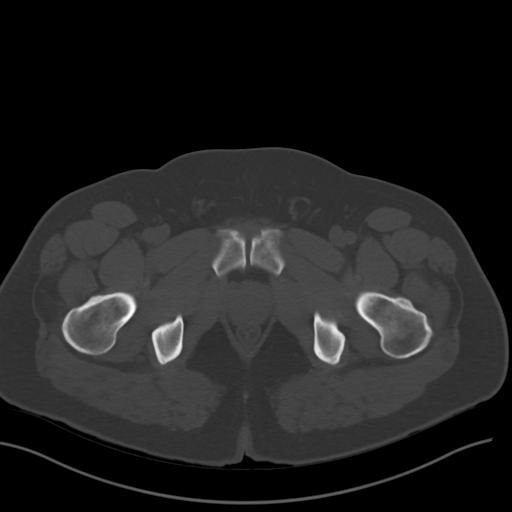
[im 14/111  soft-tissue]
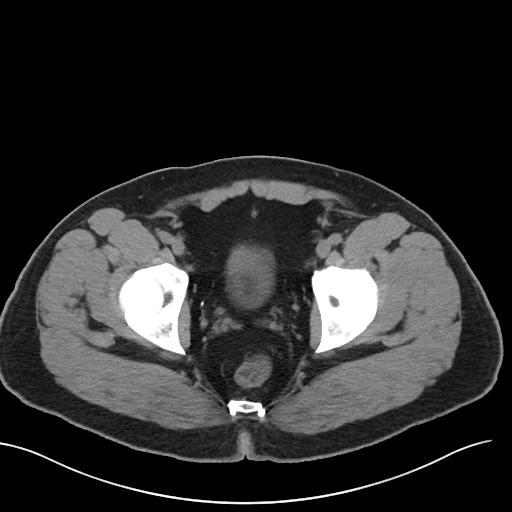
[im 23/111  soft-tissue]
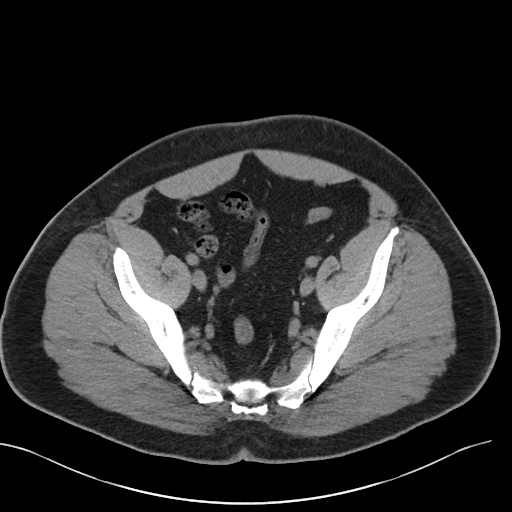
[im 33/111  soft-tissue]
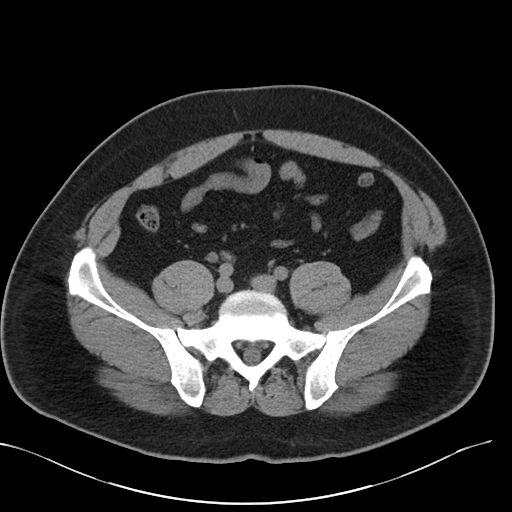
[im 37/111  soft-tissue]
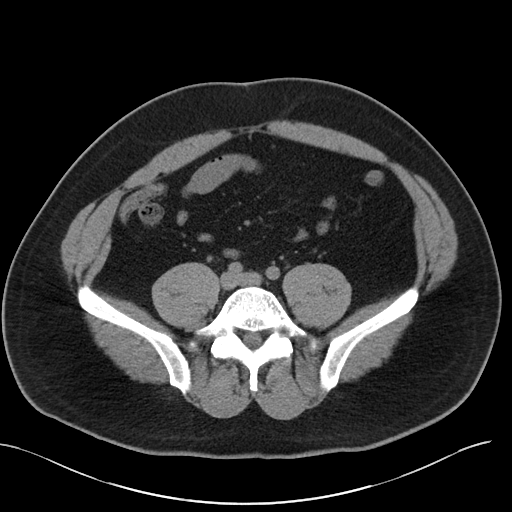
[im 46/111  soft-tissue]
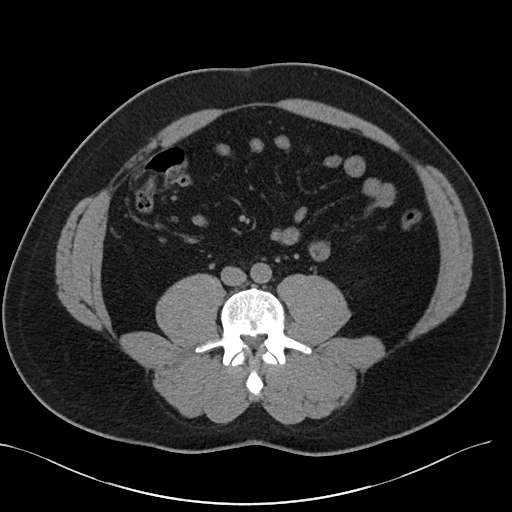
[im 56/111  soft-tissue]
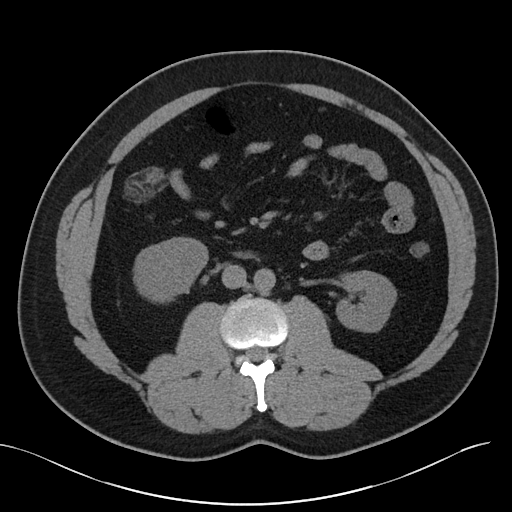
[im 65/111  soft-tissue]
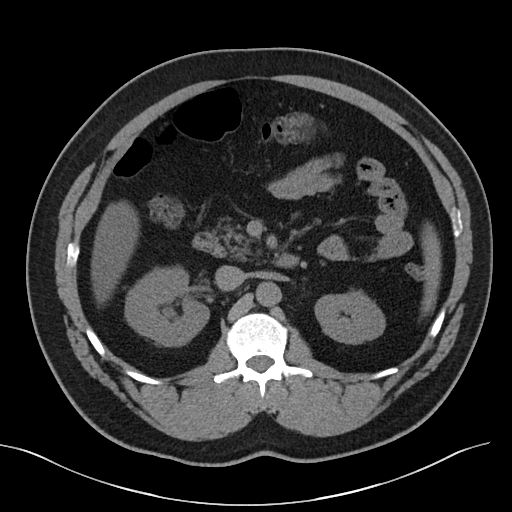
[im 74/111  soft-tissue]
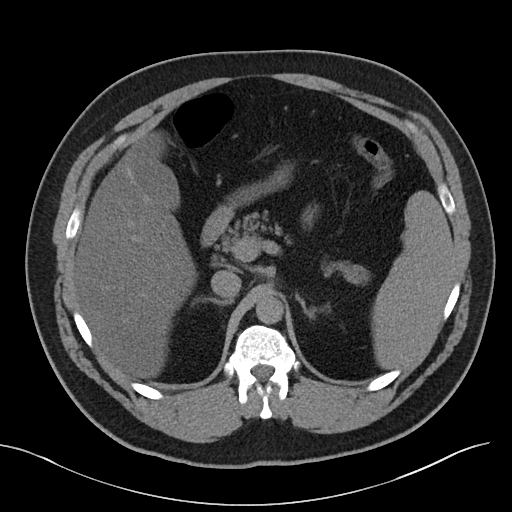
[im 74/111  bone]
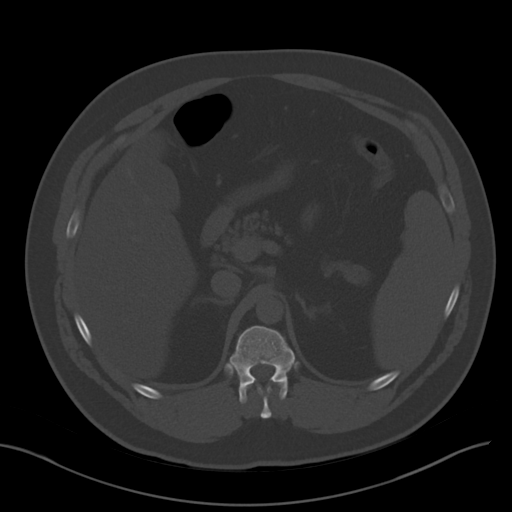
[im 78/111  soft-tissue]
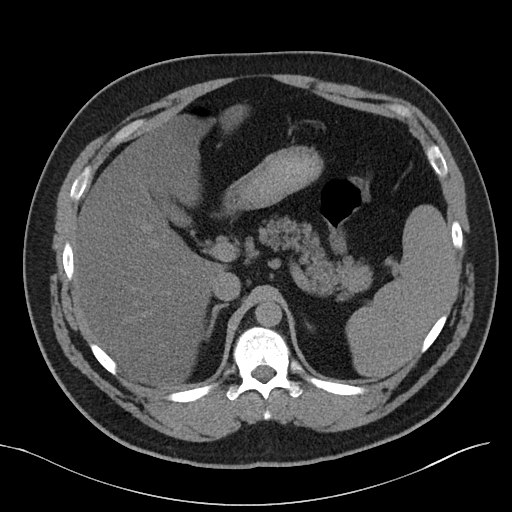
[im 88/111  soft-tissue]
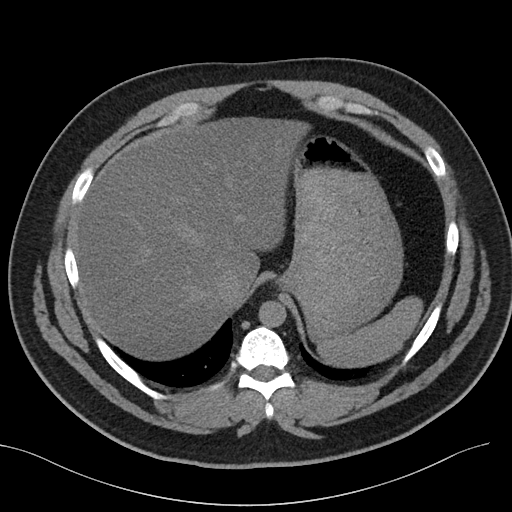
[im 97/111  soft-tissue]
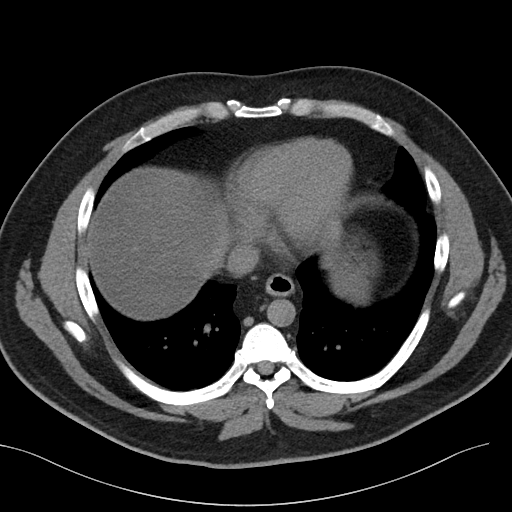
[im 106/111  soft-tissue]
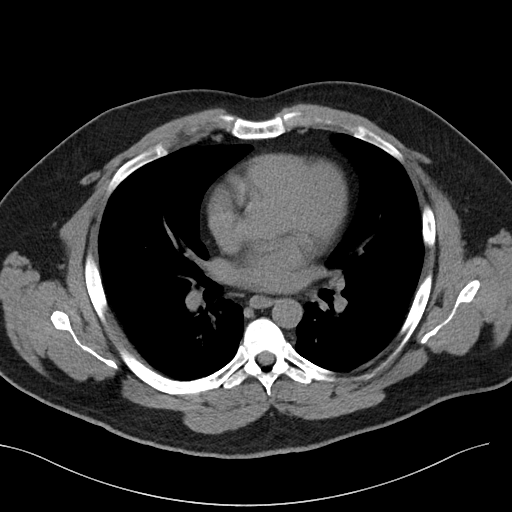

[Series 4: renal stone 3.0 cor · coronal · 0.80mm/px · 3 of 107 slices shown]
[im 36/107  soft-tissue]
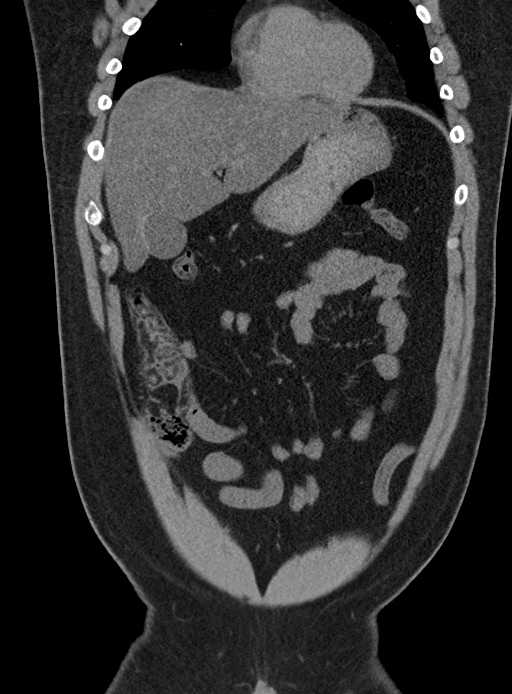
[im 48/107  soft-tissue]
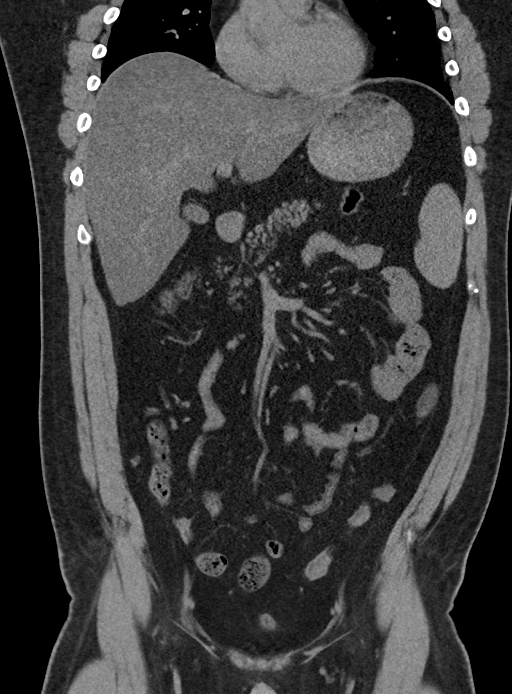
[im 59/107  soft-tissue]
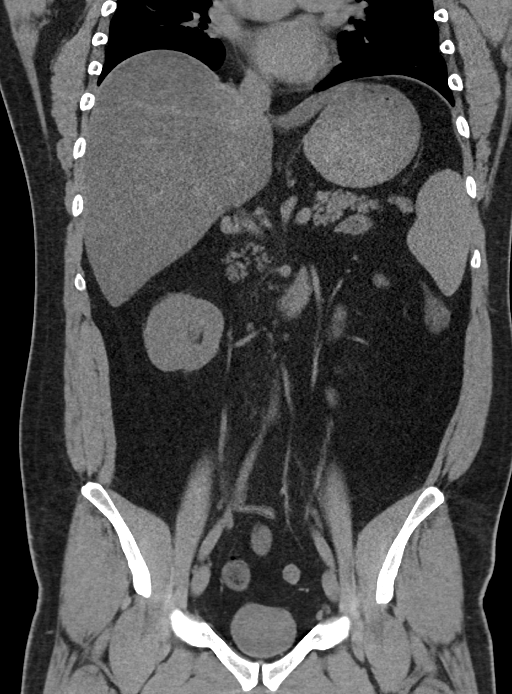

[16 of 46 positions shown; findings below may reference images not displayed]

FINDINGS: Lower chest: Mild dependent atelectasis is present at the lung bases
bilaterally without other focal airspace disease, nodule, or mass
lesion. The heart size is normal. No significant pleural or
pericardial effusion is present.

Hepatobiliary: There is diffuse fatty infiltration of the liver. No
significant nodularity is present. No mass lesion is present. The
common bile duct and gallbladder are within normal limits.

Pancreas: The pancreas is unremarkable.

Spleen: The spleen is within normal limits.

Adrenals/Urinary Tract: Adrenal glands are normal bilaterally. Mild
right-sided hydronephrosis is present. The right ureter is dilated
into the level of the right UVJ where a 2.5 mm stone is obstructing.
The left kidney and ureter are within normal limits. No additional
stones are present. The urinary bladder is within normal limits
otherwise.

Stomach/Bowel: The stomach and duodenum are within normal limits.
Small bowel is unremarkable. The appendix is visualized and normal.
The ascending and transverse colon are within normal limits. The
descending and rectosigmoid colon are unremarkable.

Vascular/Lymphatic: No significant vascular calcifications are
present. There is no significant adenopathy.

Reproductive: Unremarkable

Other: No significant free fluid is present. There are no
significant hernias.

Musculoskeletal: Bone windows demonstrate no focal lytic or blastic
lesions. Vertebral body heights and alignment are maintained.
IMPRESSION: 1. Mild right-sided hydronephrosis and ureteral dilation with an
obstructing 2.5 mm stone at the right UVJ.
2. No other stones or left-sided hydronephrosis.
3. Hepatic steatosis.

## 2016-10-19 ENCOUNTER — Telehealth: Payer: 59 | Admitting: Family

## 2016-10-19 DIAGNOSIS — B9689 Other specified bacterial agents as the cause of diseases classified elsewhere: Secondary | ICD-10-CM | POA: Diagnosis not present

## 2016-10-19 DIAGNOSIS — J329 Chronic sinusitis, unspecified: Secondary | ICD-10-CM | POA: Diagnosis not present

## 2016-10-19 MED ORDER — DOXYCYCLINE HYCLATE 100 MG PO TABS: 100.0000 mg | ORAL_TABLET | Freq: Two times a day (BID) | ORAL | 0 refills | Status: DC

## 2016-10-19 MED FILL — DOXYCYCLINE HYCLATE 100 MG: 100 | 10 days supply | Qty: 20 | Fill #0

## 2016-10-19 NOTE — Progress Notes (Signed)

## 2017-01-19 ENCOUNTER — Telehealth: Payer: 59 | Admitting: Family

## 2017-01-19 DIAGNOSIS — J019 Acute sinusitis, unspecified: Secondary | ICD-10-CM | POA: Diagnosis not present

## 2017-01-19 DIAGNOSIS — B9689 Other specified bacterial agents as the cause of diseases classified elsewhere: Secondary | ICD-10-CM

## 2017-01-19 MED ORDER — DOXYCYCLINE HYCLATE 100 MG PO TABS: 100.0000 mg | ORAL_TABLET | Freq: Two times a day (BID) | ORAL | 0 refills | Status: DC

## 2017-01-19 NOTE — Progress Notes (Signed)

## 2017-07-05 ENCOUNTER — Encounter: Payer: Self-pay | Admitting: Family Medicine

## 2017-07-06 ENCOUNTER — Telehealth: Payer: 59 | Admitting: Family

## 2017-07-06 DIAGNOSIS — L255 Unspecified contact dermatitis due to plants, except food: Secondary | ICD-10-CM | POA: Diagnosis not present

## 2017-07-06 MED ORDER — PREDNISONE 10 MG (21) PO TBPK: ORAL_TABLET | ORAL | 0 refills | Status: DC

## 2017-07-06 NOTE — Progress Notes (Signed)

## 2017-12-30 ENCOUNTER — Encounter: Payer: Self-pay | Admitting: Family Medicine

## 2017-12-30 ENCOUNTER — Ambulatory Visit (INDEPENDENT_AMBULATORY_CARE_PROVIDER_SITE_OTHER): Payer: 59 | Admitting: Family Medicine

## 2017-12-30 VITALS — BP 120/87 | HR 84 | Temp 97.1°F | Ht 66.0 in | Wt 273.6 lb

## 2017-12-30 DIAGNOSIS — M1A0720 Idiopathic chronic gout, left ankle and foot, without tophus (tophi): Secondary | ICD-10-CM | POA: Diagnosis not present

## 2017-12-30 DIAGNOSIS — Z0000 Encounter for general adult medical examination without abnormal findings: Secondary | ICD-10-CM | POA: Diagnosis not present

## 2017-12-30 DIAGNOSIS — Z7689 Persons encountering health services in other specified circumstances: Secondary | ICD-10-CM | POA: Diagnosis not present

## 2017-12-30 MED ORDER — COLCHICINE 0.6 MG PO TABS: ORAL_TABLET | ORAL | 3 refills | Status: DC

## 2017-12-30 NOTE — Progress Notes (Signed)
   HPI  Patient presents today for annual physical exam and to discuss gout.  Gout Patient states that he is having a gout flare of the left foot notes of happening approximately twice a month.  He states that last a day or 2 and does not have any medications that seem to be very effective.  He is tried naproxen without improvement. He does not want to take an everyday medication yet.  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 He is watching his diet.   PMH: Smoking status noted ROS: Per HPI  Objective: BP 120/87   Pulse 84   Temp (!) 97.1 F (36.2 C) (Oral)   Ht '5\' 6"'$  (1.676 m)   Wt 273 lb 9.6 oz (124.1 kg)   BMI 44.16 kg/m  Gen: NAD, alert, cooperative with exam HEENT: NCAT, EOMI, PERRL CV: RRR, good S1/S2, no murmur Resp: CTABL, no wheezes, non-labored Abd: SNTND, BS present, no guarding or organomegaly Ext: No edema, warm Neuro: Alert and oriented, No gross deficits  Assessment and plan:  #Annual physical exam Normal exam except for weight, discussed therapeutic lifestyle changes. Labs today, fasting Also discussed elevated LFTs-he does not drink alcohol, he does not frequently take Tylenol.   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 Discussed usual dosing Follow-up as needed     Orders Placed This Encounter  Procedures  . CMP14+EGFR  . CBC with Differential/Platelet  . Lipid panel  . TSH  . Uric acid    Meds ordered this encounter  Medications  . colchicine 0.6 MG tablet    Sig: 2 tabs of the first sign of a flare followed by 1 tab 1 hour later, then take 1 tablet daily until the flare completely resolves    Dispense:  30 tablet    Refill:  [place], MD [facility] 12/30/2017, 11:32 AM

## 2017-12-30 NOTE — Patient Instructions (Signed)
Great to see you!  Come back in 1 year unless you need us sooner.   For gout flares Try colchicine 2 tablets at the first sign of a flare, then 1 tablet 1 hour later.  Then continue 1 tablet daily until the flare completely resolves.

## 2017-12-31 LAB — CBC WITH DIFFERENTIAL/PLATELET
Basophils Absolute: 0 10*3/uL (ref 0.0–0.2)
Basos: 0 %
EOS (ABSOLUTE): 0.3 10*3/uL (ref 0.0–0.4)
Eos: 5 %
HEMOGLOBIN: 15.8 g/dL (ref 13.0–17.7)
Hematocrit: 45.5 % (ref 37.5–51.0)
Immature Grans (Abs): 0 10*3/uL (ref 0.0–0.1)
Immature Granulocytes: 0 %
LYMPHS ABS: 2.1 10*3/uL (ref 0.7–3.1)
Lymphs: 29 %
MCH: 30.3 pg (ref 26.6–33.0)
MCHC: 34.7 g/dL (ref 31.5–35.7)
MCV: 87 fL (ref 79–97)
MONOCYTES: 7 %
Monocytes Absolute: 0.5 10*3/uL (ref 0.1–0.9)
Neutrophils Absolute: 4.3 10*3/uL (ref 1.4–7.0)
Neutrophils: 59 %
Platelets: 213 10*3/uL (ref 150–379)
RBC: 5.21 x10E6/uL (ref 4.14–5.80)
RDW: 14.4 % (ref 12.3–15.4)
WBC: 7.3 10*3/uL (ref 3.4–10.8)

## 2017-12-31 LAB — CMP14+EGFR
ALBUMIN: 4.7 g/dL (ref 3.5–5.5)
ALK PHOS: 110 IU/L (ref 39–117)
ALT: 56 IU/L — AB (ref 0–44)
AST: 33 IU/L (ref 0–40)
Albumin/Globulin Ratio: 1.7 (ref 1.2–2.2)
BILIRUBIN TOTAL: 0.6 mg/dL (ref 0.0–1.2)
BUN / CREAT RATIO: 11 (ref 9–20)
BUN: 12 mg/dL (ref 6–20)
CO2: 22 mmol/L (ref 20–29)
Calcium: 9.5 mg/dL (ref 8.7–10.2)
Chloride: 106 mmol/L (ref 96–106)
Creatinine, Ser: 1.12 mg/dL (ref 0.76–1.27)
GFR calc Af Amer: 97 mL/min/{1.73_m2} (ref >59)
GFR calc non Af Amer: 84 mL/min/{1.73_m2} (ref >59)
GLUCOSE: 115 mg/dL — AB (ref 65–99)
Globulin, Total: 2.7 g/dL (ref 1.5–4.5)
Potassium: 4.6 mmol/L (ref 3.5–5.2)
Sodium: 143 mmol/L (ref 134–144)
Total Protein: 7.4 g/dL (ref 6.0–8.5)

## 2017-12-31 LAB — LIPID PANEL
CHOLESTEROL TOTAL: 188 mg/dL (ref 100–199)
Chol/HDL Ratio: 5.1 ratio — ABNORMAL HIGH (ref 0.0–5.0)
HDL: 37 mg/dL — ABNORMAL LOW (ref >39)
LDL CALC: 120 mg/dL — AB (ref 0–99)
TRIGLYCERIDES: 156 mg/dL — AB (ref 0–149)
VLDL Cholesterol Cal: 31 mg/dL (ref 5–40)

## 2017-12-31 LAB — TSH: TSH: 1.14 u[IU]/mL (ref 0.450–4.500)

## 2017-12-31 LAB — URIC ACID: Uric Acid: 9.5 mg/dL — ABNORMAL HIGH (ref 3.7–8.6)

## 2018-01-04 LAB — SPECIMEN STATUS REPORT

## 2018-01-04 LAB — HGB A1C W/O EAG: HEMOGLOBIN A1C: 5.6 % (ref 4.8–5.6)

## 2018-01-20 ENCOUNTER — Telehealth: Payer: 59 | Admitting: Family

## 2018-01-20 DIAGNOSIS — B9689 Other specified bacterial agents as the cause of diseases classified elsewhere: Secondary | ICD-10-CM

## 2018-01-20 DIAGNOSIS — J329 Chronic sinusitis, unspecified: Secondary | ICD-10-CM

## 2018-01-20 MED ORDER — DOXYCYCLINE HYCLATE 100 MG PO TABS: 100.0000 mg | ORAL_TABLET | Freq: Two times a day (BID) | ORAL | 0 refills | Status: DC

## 2018-01-20 NOTE — Progress Notes (Signed)
Thank you for the details you included in the comment boxes. Those details are very helpful in determining the best course of treatment for you and help us to provide the best care.  We are sorry that you are not feeling well.  Here is how we plan to help!  Based on what you have shared with me it looks like you have sinusitis.  Sinusitis is inflammation and infection in the sinus cavities of the head.  Based on your presentation I believe you most likely have Acute Bacterial Sinusitis.  This is an infection caused by bacteria and is treated with antibiotics. I have prescribed Doxycycline 100mg by mouth twice a day for 10 days. You may use an oral decongestant such as Mucinex D or if you have glaucoma or high blood pressure use plain Mucinex. Saline nasal spray help and can safely be used as often as needed for congestion.  If you develop worsening sinus pain, fever or notice severe headache and vision changes, or if symptoms are not better after completion of antibiotic, please schedule an appointment with a health care provider.    Sinus infections are not as easily transmitted as other respiratory infection, however we still recommend that you avoid close contact with loved ones, especially the very young and elderly.  Remember to wash your hands thoroughly throughout the day as this is the number one way to prevent the spread of infection!  Home Care:  Only take medications as instructed by your medical team.  Complete the entire course of an antibiotic.  Do not take these medications with alcohol.  A steam or ultrasonic humidifier can help congestion.  You can place a towel over your head and breathe in the steam from hot water coming from a faucet.  Avoid close contacts especially the very young and the elderly.  Cover your mouth when you cough or sneeze.  Always remember to wash your hands.  Get Help Right Away If:  You develop worsening fever or sinus pain.  You develop a severe  head ache or visual changes.  Your symptoms persist after you have completed your treatment plan.  Make sure you  Understand these instructions.  Will watch your condition.  Will get help right away if you are not doing well or get worse.  Your e-visit answers were reviewed by a board certified advanced clinical practitioner to complete your personal care plan.  Depending on the condition, your plan could have included both over the counter or prescription medications.  If there is a problem please reply  once you have received a response from your provider.  Your safety is important to us.  If you have drug allergies check your prescription carefully.    You can use MyChart to ask questions about today's visit, request a non-urgent call back, or ask for a work or school excuse for 24 hours related to this e-Visit. If it has been greater than 24 hours you will need to follow up with your provider, or enter a new e-Visit to address those concerns.  You will get an e-mail in the next two days asking about your experience.  I hope that your e-visit has been valuable and will speed your recovery. Thank you for using e-visits.     

## 2018-03-17 ENCOUNTER — Telehealth: Payer: 59 | Admitting: Nurse Practitioner

## 2018-03-17 DIAGNOSIS — J0100 Acute maxillary sinusitis, unspecified: Secondary | ICD-10-CM

## 2018-03-17 DIAGNOSIS — J329 Chronic sinusitis, unspecified: Secondary | ICD-10-CM | POA: Diagnosis not present

## 2018-03-17 DIAGNOSIS — B9689 Other specified bacterial agents as the cause of diseases classified elsewhere: Secondary | ICD-10-CM

## 2018-03-17 MED ORDER — DOXYCYCLINE HYCLATE 100 MG PO TABS: 100.0000 mg | ORAL_TABLET | Freq: Two times a day (BID) | ORAL | 0 refills | Status: DC

## 2018-03-17 MED ORDER — AZELASTINE HCL 0.15 % NA SOLN: 2.0000 | Freq: Every day | NASAL | 1 refills | Status: DC

## 2018-03-17 NOTE — Addendum Note (Signed)
Addended by: Bennie PieriniMARTIN, MARY-MARGARET on: 03/17/2018 07:42 PM   Modules accepted: Orders

## 2018-03-17 NOTE — Progress Notes (Signed)

## 2018-05-08 ENCOUNTER — Telehealth: Payer: 59 | Admitting: Family

## 2018-05-08 DIAGNOSIS — L255 Unspecified contact dermatitis due to plants, except food: Secondary | ICD-10-CM

## 2018-05-08 MED ORDER — PREDNISONE 10 MG (21) PO TBPK: ORAL_TABLET | ORAL | 0 refills | Status: DC

## 2018-05-08 NOTE — Progress Notes (Signed)

## 2019-04-13 ENCOUNTER — Other Ambulatory Visit: Payer: Self-pay

## 2019-04-13 ENCOUNTER — Ambulatory Visit (INDEPENDENT_AMBULATORY_CARE_PROVIDER_SITE_OTHER): Payer: 59 | Admitting: Family Medicine

## 2019-04-13 DIAGNOSIS — M109 Gout, unspecified: Secondary | ICD-10-CM | POA: Diagnosis not present

## 2019-04-13 DIAGNOSIS — E782 Mixed hyperlipidemia: Secondary | ICD-10-CM | POA: Diagnosis not present

## 2019-04-13 MED ORDER — PREDNISONE 10 MG PO TABS: ORAL_TABLET | ORAL | 0 refills | Status: DC

## 2019-04-13 NOTE — Progress Notes (Signed)
Telephone visit  Subjective: CC: gout PCP: Janora Norlander, DO PIR:JJOAC R [name] is a 38 y.o. male calls for telephone consult today. Patient provides verbal consent for consult held via phone.  Location of patient: work/base Location of provider: [facility] Others present for call: none  1. 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  He does report quite a bit of consumption of red meats but notes that he has not had any change in diet recently.  He occasionally has alcohol but nothing significant.  No change in weight.  He has been using colchicine as prescribed.  Gout flares tend to affect the right ankle 99% of the time but occasionally affect the left ankle.  He notes that symptoms resolve after 24 hours but he often will have significant GI side effects related to colchicine use.  He is calling for advice.   ROS: Per HPI  Allergies  Allergen Reactions  . Penicillins     Has patient had a PCN reaction causing immediate rash, facial/tongue/throat swelling, SOB or lightheadedness with hypotension: Yes Has patient had a PCN reaction causing severe rash involving mucus membranes or skin necrosis: No Has patient had a PCN reaction that required hospitalization No Has patient had a PCN reaction occurring within the last 10 years: No If all of the above answers are "NO", then may proceed with Cephalosporin use.    No past medical history on file.  Current Outpatient Medications:  .  Azelastine HCl (ASTEPRO) 0.15 % SOLN, Place 2 sprays into the nose daily., Disp: 30 mL, Rfl: 1 .  colchicine 0.6 MG tablet, 2 tabs of the first sign of a flare followed by 1 tab 1 hour later, then take 1 tablet daily until the flare completely resolves, Disp: 30 tablet, Rfl: 3 .  doxycycline (VIBRA-TABS) 100 MG tablet, Take 1 tablet (100 mg total) by mouth 2 (two) times daily., Disp: 20 tablet, Rfl: 0 .  predniSONE (STERAPRED UNI-PAK 21 TAB) 10 MG (21) TBPK tablet, Use as directed, Disp:  21 tablet, Rfl: 0  Assessment/ Plan: 38 y.o. male   1. Acute gout of right ankle, unspecified cause Recurrent despite use of colchicine.  Will try treating him for a different ankle with prednisone.  I put him on a long taper to reduce risk of rebound.  We discussed consideration for initiation of allopurinol.  I reviewed his last laboratory work-up which noted a uric acid level of greater than 9.  Goal will be to get this gentleman less than 7 so as to reduce risk of joint erosion/degradation.  I have placed future order so that he may come in for fasting labs on Monday.  We discussed reasons for reevaluation.  He voiced good understanding - Uric Acid; Future - CMP14+EGFR; Future - CBC; Future - predniSONE (DELTASONE) 10 MG tablet; Take '60mg'$  by mouth day 1-2, '50mg'$  day 3-4, '40mg'$  day 5-6, '30mg'$  day 7-8, '20mg'$  day 9-10, '10mg'$  day 11-12.  Then stop.  Dispense: 42 tablet; Refill: 0  2. Moderate mixed hyperlipidemia not requiring statin therapy Noted to be elevated on last check in January 2019.  Check fasting lipid panel. - Lipid Panel; Future  Start time: 2:10pm End time: 2:18pm  Total time spent on patient care (including telephone call/ virtual visit): 15 minutes  [place], [place] [phone]

## 2019-04-13 NOTE — Patient Instructions (Signed)

## 2019-04-16 ENCOUNTER — Other Ambulatory Visit: Payer: 59

## 2019-04-16 ENCOUNTER — Other Ambulatory Visit: Payer: Self-pay

## 2019-04-16 DIAGNOSIS — M109 Gout, unspecified: Secondary | ICD-10-CM

## 2019-04-16 DIAGNOSIS — E782 Mixed hyperlipidemia: Secondary | ICD-10-CM

## 2019-04-17 LAB — URIC ACID: Uric Acid: 7.4 mg/dL (ref 3.7–8.6)

## 2019-04-17 LAB — CBC
Hematocrit: 46.1 % (ref 37.5–51.0)
Hemoglobin: 15.6 g/dL (ref 13.0–17.7)
MCH: 29.1 pg (ref 26.6–33.0)
MCHC: 33.8 g/dL (ref 31.5–35.7)
MCV: 86 fL (ref 79–97)
Platelets: 258 10*3/uL (ref 150–450)
RBC: 5.36 x10E6/uL (ref 4.14–5.80)
RDW: 14 % (ref 11.6–15.4)
WBC: 13.9 10*3/uL — ABNORMAL HIGH (ref 3.4–10.8)

## 2019-04-17 LAB — CMP14+EGFR
ALT: 55 IU/L — ABNORMAL HIGH (ref 0–44)
AST: 31 IU/L (ref 0–40)
Albumin/Globulin Ratio: 1.8 (ref 1.2–2.2)
Albumin: 4.6 g/dL (ref 4.0–5.0)
Alkaline Phosphatase: 105 IU/L (ref 39–117)
BUN/Creatinine Ratio: 13 (ref 9–20)
BUN: 17 mg/dL (ref 6–20)
Bilirubin Total: 0.3 mg/dL (ref 0.0–1.2)
CO2: 17 mmol/L — ABNORMAL LOW (ref 20–29)
Calcium: 9.3 mg/dL (ref 8.7–10.2)
Chloride: 106 mmol/L (ref 96–106)
Creatinine, Ser: 1.26 mg/dL (ref 0.76–1.27)
GFR calc Af Amer: 84 mL/min/{1.73_m2} (ref >59)
GFR calc non Af Amer: 72 mL/min/{1.73_m2} (ref >59)
Globulin, Total: 2.5 g/dL (ref 1.5–4.5)
Glucose: 104 mg/dL — ABNORMAL HIGH (ref 65–99)
Potassium: 3.8 mmol/L (ref 3.5–5.2)
Sodium: 140 mmol/L (ref 134–144)
Total Protein: 7.1 g/dL (ref 6.0–8.5)

## 2019-04-17 LAB — LIPID PANEL
Chol/HDL Ratio: 4.9 ratio (ref 0.0–5.0)
Cholesterol, Total: 202 mg/dL — ABNORMAL HIGH (ref 100–199)
HDL: 41 mg/dL (ref >39)
LDL Calculated: 129 mg/dL — ABNORMAL HIGH (ref 0–99)
Triglycerides: 158 mg/dL — ABNORMAL HIGH (ref 0–149)
VLDL Cholesterol Cal: 32 mg/dL (ref 5–40)

## 2019-04-30 ENCOUNTER — Encounter: Payer: Self-pay | Admitting: Family Medicine

## 2019-05-01 ENCOUNTER — Other Ambulatory Visit: Payer: Self-pay | Admitting: Family Medicine

## 2019-05-01 DIAGNOSIS — M109 Gout, unspecified: Secondary | ICD-10-CM

## 2019-05-01 MED ORDER — ALLOPURINOL 100 MG PO TABS: 100.0000 mg | ORAL_TABLET | Freq: Every day | ORAL | 6 refills | Status: DC

## 2019-06-27 ENCOUNTER — Ambulatory Visit: Payer: 59 | Admitting: Family Medicine

## 2019-08-29 ENCOUNTER — Telehealth: Payer: 59 | Admitting: Family

## 2019-08-29 DIAGNOSIS — L255 Unspecified contact dermatitis due to plants, except food: Secondary | ICD-10-CM | POA: Diagnosis not present

## 2019-08-29 MED ORDER — PREDNISONE 10 MG (21) PO TBPK: ORAL_TABLET | ORAL | 0 refills | Status: DC

## 2019-08-29 NOTE — Progress Notes (Signed)

## 2019-11-19 ENCOUNTER — Telehealth: Payer: Self-pay | Admitting: Family Medicine

## 2019-11-19 ENCOUNTER — Telehealth: Payer: 59 | Admitting: Physician Assistant

## 2019-11-19 DIAGNOSIS — N390 Urinary tract infection, site not specified: Secondary | ICD-10-CM

## 2019-11-19 NOTE — Progress Notes (Signed)
Based on what you shared with me, I feel your condition warrants further evaluation and I recommend that you be seen for a face to face office visit.  Male bladder infections are not very common.  We worry about prostate or kidney conditions.  The standard of care is to examine the abdomen and kidneys, and to do a urine and blood test to make sure that something more serious is not going on.  We recommend that you see a provider today.  If your doctor's office is closed [facility] has the following Urgent Cares:    NOTE: If you entered your credit card information for this eVisit, you will not be charged. You may see a "hold" on your card for the $35 but that hold will drop off and you will not have a charge processed.   If you are having a true medical emergency please call 911.     For an urgent face to face visit, [facility] has four urgent care centers for your convenience:    NEW:  [facility] [phone] [address] [place], [place] [address] .  Monday - Friday 10 am - 6 pm    . [facility]    [phone]                  Get Driving Directions  [address] [place], [place] [address] . 10 am to 8 pm Monday-Friday . 12 pm to 8 pm Saturday-Sunday   . [facility]  [phone]                  Get Driving Directions  [address] [place], [place] [address] . 8 am to 8 pm Monday-Friday . 9 am to 6 pm Saturday . 11 am to 6 pm Sunday   . [facility] [place]  [phone]                  Get Driving Directions   [address] [place], [place] [address] . 8 am to 8 pm Monday-Friday . 8 am to 4 pm Saturday-Sunday    . [facility] [place]                    Get Driving Directions  [phone]  [address] [place], [place] [address]  . Monday-Friday, 12 PM to 6 PM    Your e-visit  answers were reviewed by a board certified advanced clinical practitioner to complete your personal care plan.  Thank you for using e-Visits.  Approximately 5 minutes was spent documenting and reviewing patient's chart.  

## 2019-11-20 ENCOUNTER — Telehealth: Payer: 59 | Admitting: Nurse Practitioner

## 2019-11-20 DIAGNOSIS — J0101 Acute recurrent maxillary sinusitis: Secondary | ICD-10-CM | POA: Diagnosis not present

## 2019-11-20 MED ORDER — LEVOFLOXACIN 500 MG PO TABS: 500.0000 mg | ORAL_TABLET | Freq: Every day | ORAL | 0 refills | Status: DC

## 2019-11-20 NOTE — Progress Notes (Signed)
We are sorry that you are not feeling well.  Here is how we plan to help!  Based on what you have shared with me it looks like you have sinusitis.  Sinusitis is inflammation and infection in the sinus cavities of the head.  Based on your presentation I believe you most likely have Acute Bacterial Sinusitis.  This is an infection caused by bacteria and is treated with antibiotics. I have prescribed Levofloxicin 500mg by mouth once daily for 7 days. You may use an oral decongestant such as Mucinex D or if you have glaucoma or high blood pressure use plain Mucinex. Saline nasal spray help and can safely be used as often as needed for congestion.  If you develop worsening sinus pain, fever or notice severe headache and vision changes, or if symptoms are not better after completion of antibiotic, please schedule an appointment with a health care provider.    Sinus infections are not as easily transmitted as other respiratory infection, however we still recommend that you avoid close contact with loved ones, especially the very young and elderly.  Remember to wash your hands thoroughly throughout the day as this is the number one way to prevent the spread of infection!  Home Care:  Only take medications as instructed by your medical team.  Complete the entire course of an antibiotic.  Do not take these medications with alcohol.  A steam or ultrasonic humidifier can help congestion.  You can place a towel over your head and breathe in the steam from hot water coming from a faucet.  Avoid close contacts especially the very young and the elderly.  Cover your mouth when you cough or sneeze.  Always remember to wash your hands.  Get Help Right Away If:  You develop worsening fever or sinus pain.  You develop a severe head ache or visual changes.  Your symptoms persist after you have completed your treatment plan.  Make sure you  Understand these instructions.  Will watch your  condition.  Will get help right away if you are not doing well or get worse.  Your e-visit answers were reviewed by a board certified advanced clinical practitioner to complete your personal care plan.  Depending on the condition, your plan could have included both over the counter or prescription medications.  If there is a problem please reply  once you have received a response from your provider.  Your safety is important to us.  If you have drug allergies check your prescription carefully.    You can use MyChart to ask questions about today's visit, request a non-urgent call back, or ask for a work or school excuse for 24 hours related to this e-Visit. If it has been greater than 24 hours you will need to follow up with your provider, or enter a new e-Visit to address those concerns.  You will get an e-mail in the next two days asking about your experience.  I hope that your e-visit has been valuable and will speed your recovery. Thank you for using e-visits. 5-10 minutes spent reviewing and documenting in chart.   

## 2019-11-24 DIAGNOSIS — U071 COVID-19: Secondary | ICD-10-CM | POA: Diagnosis not present

## 2019-11-28 ENCOUNTER — Telehealth: Payer: 59 | Admitting: Nurse Practitioner

## 2019-11-28 DIAGNOSIS — U071 COVID-19: Secondary | ICD-10-CM | POA: Diagnosis not present

## 2019-11-28 MED ORDER — BENZONATATE 100 MG PO CAPS: 100.0000 mg | ORAL_CAPSULE | Freq: Three times a day (TID) | ORAL | 0 refills | Status: DC | PRN

## 2019-11-28 MED ORDER — PREDNISONE 10 MG (21) PO TBPK: ORAL_TABLET | ORAL | 0 refills | Status: DC

## 2019-11-28 NOTE — Progress Notes (Signed)
Your test for COVID-19 was positive, meaning that you were infected with the novel coronavirus and could give the germ to others.    You have been enrolled in [facility] for COVID-19. Daily you will receive a questionnaire within the [facility] website. Our COVID-19 response team will be monitoring your responses daily.  Please continue isolation at home, for at least 10 days since the start of your symptoms and until you have had 24 hours with no fever (without taking a fever reducer) and with improving of symptoms.  Please continue good preventive care measures, including:  frequent hand-washing, avoid touching your face, cover coughs/sneezes, stay out of crowds and keep a 6 foot distance from others.  Recheck or go to the nearest hospital ED tent for re-assessment if fever/cough/breathlessness return.   I have prescribed you tessalon perles and prednisone for your cough. If this does not help you will need to contatc your PCP  Directions for 6 day prednisone taper: Day 1: 2 tablets before breakfast, 1 after both lunch & dinner and 2 at bedtime Day 2: 1 tab before breakfast, 1 after both lunch & dinner and 2 at bedtime Day 3: 1 tab at each meal & 1 at bedtime Day 4: 1 tab at breakfast, 1 at lunch, 1 at bedtime Day 5: 1 tab at breakfast & 1 tab at bedtime Day 6: 1 tab at breakfast  5-10 minutes spent reviewing and documenting in chart.

## 2019-12-19 DIAGNOSIS — Z23 Encounter for immunization: Secondary | ICD-10-CM | POA: Diagnosis not present

## 2020-01-17 DIAGNOSIS — Z23 Encounter for immunization: Secondary | ICD-10-CM | POA: Diagnosis not present

## 2020-04-04 ENCOUNTER — Telehealth: Payer: 59 | Admitting: Emergency Medicine

## 2020-04-04 DIAGNOSIS — L237 Allergic contact dermatitis due to plants, except food: Secondary | ICD-10-CM | POA: Diagnosis not present

## 2020-04-04 MED ORDER — PREDNISONE 20 MG PO TABS: ORAL_TABLET | ORAL | 0 refills | Status: DC

## 2020-04-04 NOTE — Progress Notes (Signed)

## 2020-04-15 ENCOUNTER — Encounter: Payer: Self-pay | Admitting: Family Medicine

## 2020-04-15 ENCOUNTER — Other Ambulatory Visit: Payer: Self-pay | Admitting: Family Medicine

## 2020-04-15 MED ORDER — COLCHICINE 0.6 MG PO TABS: ORAL_TABLET | ORAL | 3 refills | Status: DC

## 2020-05-13 ENCOUNTER — Telehealth: Payer: 59 | Admitting: Physician Assistant

## 2020-05-13 DIAGNOSIS — L255 Unspecified contact dermatitis due to plants, except food: Secondary | ICD-10-CM | POA: Diagnosis not present

## 2020-05-13 MED ORDER — PREDNISONE 20 MG PO TABS: ORAL_TABLET | ORAL | 0 refills | Status: DC

## 2020-05-13 NOTE — Progress Notes (Signed)

## 2020-09-22 ENCOUNTER — Telehealth: Payer: 59 | Admitting: Physician Assistant

## 2020-09-22 DIAGNOSIS — M109 Gout, unspecified: Secondary | ICD-10-CM | POA: Diagnosis not present

## 2020-09-22 MED ORDER — PREDNISONE 10 MG (21) PO TBPK: ORAL_TABLET | Freq: Every day | ORAL | 0 refills | Status: DC

## 2020-09-22 MED ORDER — NAPROXEN 500 MG PO TABS: 500.0000 mg | ORAL_TABLET | Freq: Two times a day (BID) | ORAL | 0 refills | Status: AC

## 2020-09-22 NOTE — Progress Notes (Signed)
E-Visit for Gout  We are sorry that you are not feeling well. We are here to help!  Based on what you shared with me it looks like you have a flare of your gout.  Gout is a form of arthritis. It can cause pain and swelling in the joints. At first, it tends to affect only 1 joint - most frequently the big toe. It happens in people who have too much uric acid in the blood. Uric acid is a chemical that is produced when the body breaks down certain foods. Uric acid can form sharp needle-like crystals that build up in the joints and cause pain. Uric acid crystals can also form inside the tubes that carry urine from the kidneys to the bladder. These crystals can turn into "kidney stones" that can cause pain and problems with the flow of urine. People with gout get sudden "flares" or attacks of severe pain, most often the big toe, ankle, or knee. Often the joint also turns red and swells. Usually, only 1 joint is affected, but some people have pain in more than 1 joint. Gout flares tend to happen more often during the night.  The pain from gout can be extreme. The pain and swelling are worst at the beginning of a gout flare. The symptoms then get better within a few days to weeks. It is not clear how the body "turns off" a gout flare.  Do not start any NEW preventative medicine until the gout has cleared completely. However, If you are already on Probenecid or Allopurinol for CHRONIC gout, you may continue taking this during an active flare up   I have prescribed Naprosyn 500 mg twice daily as needed for mild pain for 7 days. I have also prescribed a course of prednisone to help with your gout.   Unfortunately, I cannot prescribe any stronger, controlled substances through an evisit. If your symptoms are not controlled by these medications or you require a narcotic you will need to be seen in person.    HOME CARE Losing weight can help relieve gout. It's not clear that following a specific diet plan will  help with gout symptoms but eating a balanced diet can help improve your overall health. It can also help you lose weight, if you are overweight. In general, a healthy diet includes plenty of fruits, vegetables, whole grains, and low-fat dairy products (labelled "low fat", skim, 2%). Avoid sugar sweetened drinks (including sodas, tea, juice and juice blends, coffee drinks and sports drinks) Limit alcohol to 1-2 drinks of beer, spirits or wine daily these can make gout flares worse. Some people with gout also have other health problems, such as heart disease, high blood pressure, kidney disease, or obesity. If you have any of these issues, it's important to work with your doctor to manage them. This can help improve your overall health and might also help with your gout.  GET HELP RIGHT AWAY IF: . Your symptoms persist after you have completed your treatment plan . You develop severe diarrhea . You develop abnormal sensations .  You develop vomiting,  .  You develop weakness .  You develop abdominal pain  FOLLOW UP WITH YOUR PRIMARY PROVIDER IF: . If your symptoms do not improve within 10 days  MAKE SURE YOU   Understand these instructions.  Will watch your condition.  Will get help right away if you are not doing well or get worse.  Your e-visit answers were reviewed by a board certified  advanced clinical practitioner to complete your personal care plan. Depending upon the condition, your plan could have included both over the counter or prescription medications.  Your safety is important to [place]. If you have drug allergies check your prescription carefully.   You can use MyChart to ask questions about today's visit, request a non-urgent call back, or ask for a work or school excuse for 24 hours related to this e-Visit. If it has been greater than 24 hours you will need to follow up with your provider, or enter a new e-Visit to address those concerns. You will get an e-mail with a link to a  survey asking about your experience.  We hope that your e-visit has been valuable and will speed your recovery! Thank you for using e-visits.  Approximately 5 minutes was spent documenting and reviewing patient's chart.

## 2020-10-01 ENCOUNTER — Encounter: Payer: Self-pay | Admitting: Family Medicine

## 2020-10-01 MED ORDER — ALLOPURINOL 100 MG PO TABS: 100.0000 mg | ORAL_TABLET | Freq: Every day | ORAL | 6 refills | Status: DC

## 2021-01-16 ENCOUNTER — Other Ambulatory Visit: Payer: Self-pay | Admitting: *Deleted

## 2021-01-19 ENCOUNTER — Telehealth: Payer: Self-pay | Admitting: *Deleted

## 2021-01-19 NOTE — Telephone Encounter (Signed)
Agree with decision.  He hasn't even had an evisit with anyone at [facility] since 03/2019.  All evisits are from outside facilities.  Needs OV and labs.

## 2021-01-19 NOTE — Telephone Encounter (Signed)
Pt called into about gout medication being denied, was informed that he would have to be seen I denied refill this morning, last OV was 04/13/19 he has had E-visits & MyChart messages with refills but has not had an office visit Please advise

## 2021-01-19 NOTE — Telephone Encounter (Signed)
I started to tell pt Dr. Delynn Flavin agreed with my decision of denying pt's refill and that he needed to be seen, he quickly told me he was at work on another call and he would take care of it.

## 2021-02-02 ENCOUNTER — Encounter: Payer: Self-pay | Admitting: Family Medicine

## 2021-02-02 ENCOUNTER — Other Ambulatory Visit: Payer: Self-pay

## 2021-02-02 ENCOUNTER — Ambulatory Visit (INDEPENDENT_AMBULATORY_CARE_PROVIDER_SITE_OTHER): Payer: 59 | Admitting: Family Medicine

## 2021-02-02 VITALS — BP 126/83 | HR 98 | Temp 98.1°F | Ht 66.0 in | Wt 277.0 lb

## 2021-02-02 DIAGNOSIS — M1009 Idiopathic gout, multiple sites: Secondary | ICD-10-CM | POA: Diagnosis not present

## 2021-02-02 DIAGNOSIS — Z0001 Encounter for general adult medical examination with abnormal findings: Secondary | ICD-10-CM

## 2021-02-02 DIAGNOSIS — Z833 Family history of diabetes mellitus: Secondary | ICD-10-CM

## 2021-02-02 DIAGNOSIS — E782 Mixed hyperlipidemia: Secondary | ICD-10-CM | POA: Diagnosis not present

## 2021-02-02 DIAGNOSIS — Z0000 Encounter for general adult medical examination without abnormal findings: Secondary | ICD-10-CM

## 2021-02-02 MED ORDER — ALLOPURINOL 100 MG PO TABS: 100.0000 mg | ORAL_TABLET | Freq: Every day | ORAL | 3 refills | Status: DC

## 2021-02-02 NOTE — Progress Notes (Signed)
Stephen Johnson is a 40 y.o. male presents to office today for annual physical exam examination.    Concerns today include: 1. Obesity/ HLD Patient is very interested in starting [facility] for weight loss.  He has a friend at work that is currently using it and has done a great job losing weight.  He admits he struggled with weight for quite some time now.  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  He was walking regularly at one point but admits that he is not exercising now.  He is tried reducing snacking.  He is essentially eliminated soda but often drinks sweet tea.  Never been on any weight loss medications, supplements or specific diets in the past.  There is a family history of type 2 diabetes in his mother.  And he has had hyperlipidemia on previous studies.  No chest pain, shortness of breath, joint pains.  No snoring, apneic spells  2.  Gout Patient has idiopathic gout that flares every few months.  He is on chronic allopurinol for this and uses colchicine for flares.  He does note that certain foods, particularly pork will aggravate his gout.  He tries to avoid these types of foods  Occupation: EMS, Marital status: single, Substance use: non smoker Diet: poor, Exercise: none Refills needed today: Allopurinol Immunizations needed:  Immunization History  Administered Date(s) Administered  . Influenza-Unspecified 09/25/2020    No past medical history on file. Social History   Socioeconomic History  . Marital status: Single    Spouse name: Not on file  . Number of children: Not on file  . Years of education: Not on file  . Highest education level: Not on file  Occupational History  . Not on file  Tobacco Use  . Smoking status: Never Smoker  . Smokeless tobacco: Not on file  Substance and Sexual Activity  . Alcohol use: No  . Drug use: No  .  Sexual activity: Not on file  Other Topics Concern  . Not on file  Social History Narrative  . Not on file   Social Determinants of Health   Financial Resource Strain: Not on file  Food Insecurity: Not on file  Transportation Needs: Not on file  Physical Activity: Not on file  Stress: Not on file  Social Connections: Not on file  Intimate Partner Violence: Not on file   No past surgical history on file. No family history on file.  Current Outpatient Medications:  .  allopurinol (ZYLOPRIM) 100 MG tablet, Take 1 tablet (100 mg total) by mouth daily., Disp: 30 tablet, Rfl: 6 .  Azelastine HCl (ASTEPRO) 0.15 % SOLN, Place 2 sprays into the nose daily., Disp: 30 mL, Rfl: 1 .  colchicine 0.6 MG tablet, 2 tabs of the first sign of a flare followed by 1 tab 1 hour later, then take 1 tablet daily until the flare completely resolves, Disp: 30 tablet, Rfl: 3  Allergies  Allergen Reactions  . Penicillins     Has patient had a PCN reaction causing immediate rash, facial/tongue/throat swelling, SOB or lightheadedness with hypotension: Yes Has patient had a PCN reaction causing severe rash involving mucus membranes or skin necrosis: No Has patient had a PCN reaction that required hospitalization No Has patient had a PCN reaction occurring within the last 10 years: No If all of  the above answers are "NO", then may proceed with Cephalosporin use.      ROS: Review of Systems Pertinent items noted in HPI and remainder of comprehensive ROS otherwise negative.    Physical exam BP 126/83   Pulse 98   Temp 98.1 F (36.7 C) (Temporal)   Ht $R'5\' 6"'VC$  (1.676 m)   Wt 277 lb (125.6 kg)   SpO2 97%   BMI 44.71 kg/m  General appearance: alert, cooperative, appears stated age, no distress and morbidly obese Head: Normocephalic, without obvious abnormality, atraumatic Eyes: negative findings: lids and lashes normal, conjunctivae and sclerae normal, corneas clear and pupils equal, round, reactive to  light and accomodation Ears: normal TM's and external ear canals both ears Nose: Nares normal. Septum midline. Mucosa normal. No drainage or sinus tenderness. Throat: lips, mucosa, and tongue normal; teeth and gums normal Neck: no adenopathy, no carotid bruit, supple, symmetrical, trachea midline and thyroid not enlarged, symmetric, no tenderness/mass/nodules Back: symmetric, no curvature. ROM normal. No CVA tenderness. Lungs: clear to auscultation bilaterally Chest wall: no tenderness Heart: regular rate and rhythm, S1, S2 normal, no murmur, click, rub or gallop Abdomen: soft, non-tender; bowel sounds normal; no masses,  no organomegaly and obese Extremities: extremities normal, atraumatic, no cyanosis or edema Pulses: 2+ and symmetric Skin: Skin color, texture, turgor normal. No rashes or lesions or small abrasion noted on right ear Lymph nodes: Cervical, supraclavicular, and axillary nodes normal. Neurologic: Alert and oriented X 3, normal strength and tone. Normal symmetric reflexes. Normal coordination and gait Psych: Mood stable, speech normal, affect appropriate.  Patient is pleasant and interactive  Abdominal circumference: 49.5"  Depression screen [facility] 2/9 02/02/2021 12/30/2017  Decreased Interest 0 0  Down, Depressed, Hopeless 0 0  PHQ - 2 Score 0 0  Altered sleeping 0 -  Tired, decreased energy 0 -  Change in appetite 0 -  Feeling bad or failure about yourself  0 -  Trouble concentrating 0 -  Moving slowly or fidgety/restless 0 -  Suicidal thoughts 0 -  PHQ-9 Score 0 -   Assessment/ Plan: [name] here for annual physical exam.   Annual physical exam  Idiopathic gout of multiple sites, unspecified chronicity - Plan: CMP14+EGFR, Uric Acid, CBC  Moderate mixed hyperlipidemia not requiring statin therapy - Plan: CMP14+EGFR, Lipid panel  Morbid obesity ([place]) - Plan: Bayer DCA Hb A1c Waived  Family history of diabetes mellitus in mother - Plan: Bayer DCA Hb A1c  Waived  Elevated BMI, classifying his morbid obesity.  We talked about options for treatment and he would like to trial [facility].  He will contact his insurance company to determine payment and also let me know what pharmacy he would like to be sent to.  I would like him to come in for fasting labs prior to sending this medicine.  He will come in sometime later this week to have these done  Having flares every 3 or so months.  Idiopathic and seems to be related to intake.  Check renal function, uric acid   Check fasting lipid panel.  Has had hyperlipidemia in the past.  May classify his metabolic syndrome  There is a family history of diabetes and so we will check an A1c as well  Counseled on healthy lifestyle choices, including diet (rich in fruits, vegetables and lean meats and low in salt and simple carbohydrates) and exercise (at least 30 minutes of moderate physical activity daily).  Meds ordered this encounter  Medications  .  allopurinol (ZYLOPRIM) 100 MG tablet    Sig: Take 1 tablet (100 mg total) by mouth daily.    Dispense:  90 tablet    Refill:  3    Acie Custis M. Lajuana Ripple, DO

## 2021-02-05 ENCOUNTER — Other Ambulatory Visit: Payer: Self-pay

## 2021-02-05 ENCOUNTER — Other Ambulatory Visit: Payer: 59

## 2021-02-05 DIAGNOSIS — Z833 Family history of diabetes mellitus: Secondary | ICD-10-CM

## 2021-02-05 DIAGNOSIS — E782 Mixed hyperlipidemia: Secondary | ICD-10-CM | POA: Diagnosis not present

## 2021-02-05 DIAGNOSIS — M1009 Idiopathic gout, multiple sites: Secondary | ICD-10-CM

## 2021-02-05 LAB — BAYER DCA HB A1C WAIVED: HB A1C (BAYER DCA - WAIVED): 7.4 % — ABNORMAL HIGH (ref <7.0)

## 2021-02-06 LAB — CMP14+EGFR
ALT: 51 IU/L — ABNORMAL HIGH (ref 0–44)
AST: 33 IU/L (ref 0–40)
Albumin/Globulin Ratio: 1.6 (ref 1.2–2.2)
Albumin: 4.6 g/dL (ref 4.0–5.0)
Alkaline Phosphatase: 130 IU/L — ABNORMAL HIGH (ref 44–121)
BUN/Creatinine Ratio: 9 (ref 9–20)
BUN: 10 mg/dL (ref 6–20)
Bilirubin Total: 0.5 mg/dL (ref 0.0–1.2)
CO2: 19 mmol/L — ABNORMAL LOW (ref 20–29)
Calcium: 9.4 mg/dL (ref 8.7–10.2)
Chloride: 101 mmol/L (ref 96–106)
Creatinine, Ser: 1.14 mg/dL (ref 0.76–1.27)
GFR calc Af Amer: 93 mL/min/{1.73_m2} (ref >59)
GFR calc non Af Amer: 81 mL/min/{1.73_m2} (ref >59)
Globulin, Total: 2.9 g/dL (ref 1.5–4.5)
Glucose: 180 mg/dL — ABNORMAL HIGH (ref 65–99)
Potassium: 4.2 mmol/L (ref 3.5–5.2)
Sodium: 136 mmol/L (ref 134–144)
Total Protein: 7.5 g/dL (ref 6.0–8.5)

## 2021-02-06 LAB — CBC
Hematocrit: 47.2 % (ref 37.5–51.0)
Hemoglobin: 16.3 g/dL (ref 13.0–17.7)
MCH: 28.7 pg (ref 26.6–33.0)
MCHC: 34.5 g/dL (ref 31.5–35.7)
MCV: 83 fL (ref 79–97)
Platelets: 275 10*3/uL (ref 150–450)
RBC: 5.67 x10E6/uL (ref 4.14–5.80)
RDW: 13.3 % (ref 11.6–15.4)
WBC: 8.7 10*3/uL (ref 3.4–10.8)

## 2021-02-06 LAB — LIPID PANEL
Chol/HDL Ratio: 5.6 ratio — ABNORMAL HIGH (ref 0.0–5.0)
Cholesterol, Total: 201 mg/dL — ABNORMAL HIGH (ref 100–199)
HDL: 36 mg/dL — ABNORMAL LOW (ref >39)
LDL Chol Calc (NIH): 136 mg/dL — ABNORMAL HIGH (ref 0–99)
Triglycerides: 160 mg/dL — ABNORMAL HIGH (ref 0–149)
VLDL Cholesterol Cal: 29 mg/dL (ref 5–40)

## 2021-02-06 LAB — URIC ACID: Uric Acid: 6.4 mg/dL (ref 3.8–8.4)

## 2021-02-20 ENCOUNTER — Ambulatory Visit: Payer: 59 | Admitting: Family Medicine

## 2021-02-20 ENCOUNTER — Encounter: Payer: Self-pay | Admitting: Family Medicine

## 2021-02-20 ENCOUNTER — Other Ambulatory Visit: Payer: Self-pay

## 2021-02-20 VITALS — BP 132/90 | HR 86 | Temp 97.7°F | Ht 69.0 in | Wt 276.0 lb

## 2021-02-20 DIAGNOSIS — E119 Type 2 diabetes mellitus without complications: Secondary | ICD-10-CM

## 2021-02-20 DIAGNOSIS — E1169 Type 2 diabetes mellitus with other specified complication: Secondary | ICD-10-CM | POA: Diagnosis not present

## 2021-02-20 DIAGNOSIS — E785 Hyperlipidemia, unspecified: Secondary | ICD-10-CM

## 2021-02-20 MED ORDER — OZEMPIC (0.25 OR 0.5 MG/DOSE) 2 MG/1.5ML ~~LOC~~ SOPN: 0.5000 mg | PEN_INJECTOR | SUBCUTANEOUS | 3 refills | Status: DC

## 2021-02-20 MED ORDER — METFORMIN HCL ER 500 MG PO TB24
500.0000 mg | ORAL_TABLET | Freq: Every day | ORAL | 3 refills | Status: DC
Start: 2021-02-20 — End: 2021-03-23

## 2021-02-20 MED ORDER — ROSUVASTATIN CALCIUM 5 MG PO TABS: 5.0000 mg | ORAL_TABLET | Freq: Every day | ORAL | 3 refills | Status: DC

## 2021-02-20 NOTE — Patient Instructions (Signed)
Sign up for coupon to get ozempic cheaper.  [email or web address].[email or web address]  Diabetes Mellitus and Standards of Medical Care Living with and managing diabetes (diabetes mellitus) can be complicated. Your diabetes treatment may be managed by a team of health care providers, including:  A physician who specializes in diabetes (endocrinologist). You might also have visits with a nurse practitioner or physician assistant.  Nurses.  A registered dietitian.  A certified diabetes care and education specialist.  An exercise specialist.  A pharmacist.  An eye doctor.  A foot specialist (podiatrist).  A dental care provider.  A primary care provider.  A mental health care provider. How to manage your diabetes You can do many things to successfully manage your diabetes. Your health care providers will follow guidelines to help you get the best quality of care. Here are general guidelines for your diabetes management plan. Your health care providers may give you more specific instructions. Physical exams When you are diagnosed with diabetes, and each year after that, your health care provider will ask about your medical and family history. You will have a physical exam, which may include:  Measuring your height, weight, and body mass index (BMI).  Checking your blood pressure. This will be done at every routine medical visit. Your target blood pressure may vary depending on your medical conditions, your age, and other factors.  A thyroid exam.  A skin exam.  Screening for nerve damage (peripheral neuropathy). This may include checking the pulse in your legs and feet and the level of sensation in your hands and feet.  A foot exam to inspect the structure and skin of your feet, including checking for cuts, bruises, redness, blisters, sores, or other problems.  Screening for blood vessel (vascular) problems. This may include checking the pulse in your  legs and feet and checking your temperature. Blood tests Depending on your treatment plan and your personal needs, you may have the following tests:  Hemoglobin A1C (HbA1C). This test provides information about blood sugar (glucose) control over the previous 2-3 months. It is used to adjust your treatment plan, if needed. This test will be done: ? At least 2 times a year, if you are meeting your treatment goals. ? 4 times a year, if you are not meeting your treatment goals or if your goals have changed.  Lipid testing, including total cholesterol, LDL and HDL cholesterol, and triglyceride levels. ? The goal for LDL is less than 100 mg/dL (5.5 mmol/L). If you are at high risk for complications, the goal is less than 70 mg/dL (3.9 mmol/L). ? The goal for HDL is 40 mg/dL (2.2 mmol/L) or higher for men, and 50 mg/dL (2.8 mmol/L) or higher for women. An HDL cholesterol of 60 mg/dL (3.3 mmol/L) or higher gives some protection against heart disease. ? The goal for triglycerides is less than 150 mg/dL (8.3 mmol/L).  Liver function tests.  Kidney function tests.  Thyroid function tests.   Dental and eye exams  Visit your dentist two times a year.  If you have type 1 diabetes, your health care provider may recommend an eye exam within 5 years after you are diagnosed, and then once a year after your first exam. ? For children with type 1 diabetes, the health care provider may recommend an eye exam when your child is age 74 or older and has had diabetes for 3-5 years. After the first exam, your child should get an eye exam once a year.  If you have type 2  diabetes, your health care provider may recommend an eye exam as soon as you are diagnosed, and then every 1-2 years after your first exam.   Immunizations  A yearly flu (influenza) vaccine is recommended annually for everyone 6 months or older. This is especially important if you have diabetes.  The pneumonia (pneumococcal) vaccine is recommended  for everyone 2 years or older who has diabetes. If you are age 53 or older, you may get the pneumonia vaccine as a series of two separate shots.  The hepatitis B vaccine is recommended for adults shortly after being diagnosed with diabetes. Adults and children with diabetes should receive all other vaccines according to age-specific recommendations from the Centers for Disease Control and Prevention (CDC). Mental and emotional health Screening for symptoms of eating disorders, anxiety, and depression is recommended at the time of diagnosis and after as needed. If your screening shows that you have symptoms, you may need more evaluation. You may work with a mental health care provider. Follow these instructions at home: Treatment plan You will monitor your blood glucose levels and may give yourself insulin. Your treatment plan will be reviewed at every medical visit. You and your health care provider will discuss:  How you are taking your medicines, including insulin.  Any side effects you have.  Your blood glucose level target goals.  How often you monitor your blood glucose level.  Lifestyle habits, such as activity level and tobacco, alcohol, and substance use. Education Your health care provider will assess how well you are monitoring your blood glucose levels and whether you are taking your insulin and medicines correctly. He or she may refer you to:  A certified diabetes care and education specialist to manage your diabetes throughout your life, starting at diagnosis.  A registered dietitian who can create and review your personal nutrition plan.  An exercise specialist who can discuss your activity level and exercise plan. General instructions  Take over-the-counter and prescription medicines only as told by your health care provider.  Keep all follow-up visits. This is important. Where to find support There are many diabetes support networks, including:  American Diabetes  Association (ADA): diabetes.org  Defeat Diabetes Foundation: defeatdiabetes.org Where to find more information  American Diabetes Association (ADA): www.diabetes.org  Association of Diabetes Care & Education Specialists (ADCES): diabeteseducator.org  International Diabetes Federation (IDF): http://hill.biz/ Summary  Managing diabetes (diabetes mellitus) can be complicated. Your diabetes treatment may be managed by a team of health care providers.  Your health care providers follow guidelines to help you get the best quality care.  You should have physical exams, blood tests, blood pressure monitoring, immunizations, and screening tests regularly. Stay updated on how to manage your diabetes.  Your health care providers may also give you more specific instructions based on your individual health. This information is not intended to replace advice given to you by your health care provider. Make sure you discuss any questions you have with your health care provider. Document Revised: 06/12/2020 Document Reviewed: 06/12/2020 Elsevier Patient Education  2021 [employer].

## 2021-02-20 NOTE — Progress Notes (Signed)
Subjective: CC: new onset DM PCP: Stephen Ip, DO TFT:DDUKG R Stephen Johnson is a 40 y.o. male presenting to clinic today for:  1. 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  Not currently treated with anti lipid or blood sugar medication.  He had a mild elevation in his ALT noted on our labs as well.  Initially interested in [place] but given new onset diabetes, would like to use Ozempic.  No reports of chest pain, dizziness  Last eye exam: Needs Last foot exam: Needs Last A1c:  Lab Results  Component Value Date   HGBA1C 7.4 (H) 02/05/2021   Nephropathy screen indicated?:  Needs Last flu, zoster and/or pneumovax:  Immunization History  Administered Date(s) Administered  . Influenza-Unspecified 09/25/2020    ROS: Per HPI  Allergies  Allergen Reactions  . Penicillins     Has patient had a PCN reaction causing immediate rash, facial/tongue/throat swelling, SOB or lightheadedness with hypotension: Yes Has patient had a PCN reaction causing severe rash involving mucus membranes or skin necrosis: No Has patient had a PCN reaction that required hospitalization No Has patient had a PCN reaction occurring within the last 10 years: No If all of the above answers are "NO", then may proceed with Cephalosporin use.    No past medical history on file.  Current Outpatient Medications:  .  allopurinol (ZYLOPRIM) 100 MG tablet, Take 1 tablet (100 mg total) by mouth daily., Disp: 90 tablet, Rfl: 3 .  colchicine 0.6 MG tablet, 2 tabs of the first sign of a flare followed by 1 tab 1 hour later, then take 1 tablet daily until the flare completely resolves, Disp: 30 tablet, Rfl: 3 Social History   Socioeconomic History  . Marital status: Single    Spouse name: Not on file  . Number of children: Not on file  . Years of education: Not on file  . Highest education level: Not on file  Occupational History  . Not on file  Tobacco Use  .  Smoking status: Never Smoker  . Smokeless tobacco: Not on file  Substance and Sexual Activity  . Alcohol use: No  . Drug use: No  . Sexual activity: Not on file  Other Topics Concern  . Not on file  Social History Narrative  . Not on file   Social Determinants of Health   Financial Resource Strain: Not on file  Food Insecurity: Not on file  Transportation Needs: Not on file  Physical Activity: Not on file  Stress: Not on file  Social Connections: Not on file  Intimate Partner Violence: Not on file   Family History  Problem Relation Age of Onset  . Diabetes Mother     Objective: Office vital signs reviewed. BP 132/90   Pulse 86   Temp 97.7 F (36.5 C) (Temporal)   Ht 5\' 9"  (1.753 m)   Wt 276 lb (125.2 kg)   SpO2 98%   BMI 40.76 kg/m   Physical Examination:  General: Awake, alert, well nourished, No acute distress HEENT: sclera white  Assessment/ Plan: 40 y.o. male   New onset type 2 diabetes mellitus (HCC) - Plan: metFORMIN (GLUCOPHAGE XR) 500 MG 24 hr tablet, Semaglutide,0.25 or 0.5MG /DOS, (OZEMPIC, 0.25 OR 0.5 MG/DOSE,) 2 MG/1.5ML SOPN, rosuvastatin (CRESTOR) 5 MG tablet  Morbid obesity (HCC) - Plan: metFORMIN (GLUCOPHAGE XR) 500 MG 24 hr tablet, Semaglutide,0.25 or 0.5MG /DOS, (OZEMPIC, 0.25 OR 0.5 MG/DOSE,) 2 MG/1.5ML SOPN  Hyperlipidemia associated with  type 2 diabetes mellitus (HCC) - Plan: rosuvastatin (CRESTOR) 5 MG tablet  A1c 7.4 on 02/05/2021.  He is here to discuss these findings and start medication.  We discussed carb modification, increase physical exercise.  Start on Metformin XR 500 mg daily.  Have also started him on low-dose Ozempic as he is very motivated to lose weight as well.  We discussed potential side effects.  6 weeks worth of samples provided.  New Rx sent to pharmacy as well.  Encouraged him to get coupon for Ozempic assistance.  Recommended eye exam, will plan for foot exam and urine micro at next visit.    Start low-dose Crestor 5 mg  nightly for hyperlipidemia in the setting of type 2 diabetes.  Plan for CMP, LFTs at next visit  I would like to at least check with Mr. Stephen Johnson in 6 weeks to review blood sugars and discuss tolerance of medications.  No orders of the defined types were placed in this encounter.  No orders of the defined types were placed in this encounter.    Stephen Ip, DO [facility] [phone]

## 2021-03-16 ENCOUNTER — Ambulatory Visit (INDEPENDENT_AMBULATORY_CARE_PROVIDER_SITE_OTHER): Payer: 59 | Admitting: Family Medicine

## 2021-03-16 ENCOUNTER — Encounter: Payer: Self-pay | Admitting: Family Medicine

## 2021-03-16 DIAGNOSIS — F419 Anxiety disorder, unspecified: Secondary | ICD-10-CM

## 2021-03-16 DIAGNOSIS — F439 Reaction to severe stress, unspecified: Secondary | ICD-10-CM | POA: Diagnosis not present

## 2021-03-16 DIAGNOSIS — F329 Major depressive disorder, single episode, unspecified: Secondary | ICD-10-CM | POA: Diagnosis not present

## 2021-03-16 MED ORDER — HYDROXYZINE HCL 50 MG PO TABS
25.0000 mg | ORAL_TABLET | Freq: Three times a day (TID) | ORAL | 0 refills | Status: DC | PRN
Start: 2021-03-16 — End: 2021-03-23

## 2021-03-16 MED ORDER — ESCITALOPRAM OXALATE 10 MG PO TABS: 10.0000 mg | ORAL_TABLET | Freq: Every day | ORAL | 1 refills | Status: DC

## 2021-03-16 NOTE — Progress Notes (Signed)
Telephone visit  Subjective: CC: depressive symptoms PCP: Raliegh Ip, DO Stephen Johnson is a 40 y.o. male calls for telephone consult today. Patient provides verbal consent for consult held via phone.  Due to COVID-19 pandemic this visit was conducted virtually. This visit type was conducted due to national recommendations for restrictions regarding the COVID-19 Pandemic (e.g. social distancing, sheltering in place) in an effort to limit this patient's exposure and mitigate transmission in our community. All issues noted in this document were discussed and addressed.  A physical exam was not performed with this format.   Location of patient: car Location of provider: [facility] Others present for call: none  1. Depression, reactive Patient reports feeling on edge.  He has difficulty focusing, sleeping.  Decreased appetite.  He has good support from friends.  Not taking anything except benadryl for sleep but has not been very helpful.  Symptoms have been worse over the last several days.  He reports that symptoms started after he broke up with his long-term girlfriend as had a difficult time dealing with it.  He is having difficulty eating, working due to poor concentration.  Denies any SI or HI.   ROS: Per HPI  Allergies  Allergen Reactions  . Penicillins     Has patient had a PCN reaction causing immediate rash, facial/tongue/throat swelling, SOB or lightheadedness with hypotension: Yes Has patient had a PCN reaction causing severe rash involving mucus membranes or skin necrosis: No Has patient had a PCN reaction that required hospitalization No Has patient had a PCN reaction occurring within the last 10 years: No If all of the above answers are "NO", then may proceed with Cephalosporin use.    No past medical history on file.  Current Outpatient Medications:  .  allopurinol (ZYLOPRIM) 100 MG tablet, Take 1 tablet (100 mg total) by mouth daily., Disp: 90 tablet, Rfl: 3 .   colchicine 0.6 MG tablet, 2 tabs of the first sign of a flare followed by 1 tab 1 hour later, then take 1 tablet daily until the flare completely resolves, Disp: 30 tablet, Rfl: 3 .  metFORMIN (GLUCOPHAGE XR) 500 MG 24 hr tablet, Take 1 tablet (500 mg total) by mouth daily with breakfast., Disp: 90 tablet, Rfl: 3 .  rosuvastatin (CRESTOR) 5 MG tablet, Take 1 tablet (5 mg total) by mouth daily., Disp: 90 tablet, Rfl: 3 .  Semaglutide,0.25 or 0.5MG /DOS, (OZEMPIC, 0.25 OR 0.5 MG/DOSE,) 2 MG/1.5ML SOPN, Inject 0.5 mg into the skin once a week., Disp: 4.5 mL, Rfl: 3  Depression screen [facility] 2/9 03/16/2021 02/20/2021 02/02/2021  Decreased Interest 3 0 0  Down, Depressed, Hopeless 3 0 0  PHQ - 2 Score 6 0 0  Altered sleeping 3 0 0  Tired, decreased energy 0 0 0  Change in appetite 2 0 0  Feeling bad or failure about yourself  3 0 0  Trouble concentrating 3 0 0  Moving slowly or fidgety/restless 3 0 0  Suicidal thoughts 0 0 0  PHQ-9 Score 20 0 0  Difficult doing work/chores Very difficult - -   GAD 7 : Generalized Anxiety Score 03/16/2021  Nervous, Anxious, on Edge 3  Control/stop worrying 3  Worry too much - different things 1  Trouble relaxing 3  Restless 3  Easily annoyed or irritable 3  Afraid - awful might happen 0  Total GAD 7 Score 16  Anxiety Difficulty Very difficult   Assessment/ Plan: 40 y.o. male   Reactive depression -  Plan: escitalopram (LEXAPRO) 10 MG tablet  Anxiety - Plan: escitalopram (LEXAPRO) 10 MG tablet, hydrOXYzine (ATARAX/VISTARIL) 50 MG tablet  Stress-related symptoms - Plan: escitalopram (LEXAPRO) 10 MG tablet  Start Lexapro 10 mg daily.  Discussed potential side effects.  We will follow-up in 6 weeks.  Patient aware of date and time.  He will reach out to me sooner if he is having any questions or concerns.  I offered counseling services today but he declined this today.  Hydroxyzine sent in to help with sleep and anxiety attacks.  We discussed sedation and  possible dry mouth with medication. Start time: 4:39pm End time: 4:50pm  Total time spent on patient care (including telephone call/ virtual visit): 11 minutes  Anneth Brunell Hulen Skains, DO [facility] [phone]

## 2021-03-23 ENCOUNTER — Other Ambulatory Visit (HOSPITAL_COMMUNITY): Payer: Self-pay

## 2021-03-23 ENCOUNTER — Encounter: Payer: Self-pay | Admitting: Family Medicine

## 2021-03-23 ENCOUNTER — Telehealth: Payer: Self-pay

## 2021-03-23 DIAGNOSIS — E785 Hyperlipidemia, unspecified: Secondary | ICD-10-CM

## 2021-03-23 DIAGNOSIS — F329 Major depressive disorder, single episode, unspecified: Secondary | ICD-10-CM

## 2021-03-23 DIAGNOSIS — E1169 Type 2 diabetes mellitus with other specified complication: Secondary | ICD-10-CM

## 2021-03-23 DIAGNOSIS — F439 Reaction to severe stress, unspecified: Secondary | ICD-10-CM

## 2021-03-23 DIAGNOSIS — E119 Type 2 diabetes mellitus without complications: Secondary | ICD-10-CM

## 2021-03-23 DIAGNOSIS — F419 Anxiety disorder, unspecified: Secondary | ICD-10-CM

## 2021-03-23 MED ORDER — ESCITALOPRAM OXALATE 10 MG PO TABS
10.0000 mg | ORAL_TABLET | Freq: Every day | ORAL | 1 refills | Status: DC
  Filled 2021-03-23: qty 30, 30d supply, fill #0

## 2021-03-23 MED ORDER — METFORMIN HCL ER 500 MG PO TB24
500.0000 mg | ORAL_TABLET | Freq: Every day | ORAL | 3 refills | Status: DC
  Filled 2021-03-23: qty 90, 90d supply, fill #0
  Filled 2021-07-11: qty 90, 90d supply, fill #1

## 2021-03-23 MED ORDER — OZEMPIC (0.25 OR 0.5 MG/DOSE) 2 MG/1.5ML ~~LOC~~ SOPN
0.5000 mg | PEN_INJECTOR | SUBCUTANEOUS | 3 refills | Status: DC
  Filled 2021-03-23 – 2021-04-10 (×2): qty 4.5, 84d supply, fill #0
  Filled 2021-07-11: qty 4.5, 84d supply, fill #1
  Filled 2021-09-28: qty 1.5, 28d supply, fill #2
  Filled 2021-11-09: qty 1.5, 28d supply, fill #3
  Filled 2021-12-01: qty 1.5, 28d supply, fill #4
  Filled 2022-01-08: qty 1.5, 28d supply, fill #5
  Filled 2022-02-09: qty 3, 56d supply, fill #6

## 2021-03-23 MED ORDER — ROSUVASTATIN CALCIUM 5 MG PO TABS
5.0000 mg | ORAL_TABLET | Freq: Every day | ORAL | 3 refills | Status: DC
  Filled 2021-03-23: qty 90, 90d supply, fill #0
  Filled 2021-06-15: qty 90, 90d supply, fill #1
  Filled 2021-09-28: qty 90, 90d supply, fill #2

## 2021-03-23 MED ORDER — HYDROXYZINE HCL 50 MG PO TABS
25.0000 mg | ORAL_TABLET | Freq: Three times a day (TID) | ORAL | 0 refills | Status: DC | PRN
  Filled 2021-03-23: qty 30, 10d supply, fill #0

## 2021-03-23 MED ORDER — ALLOPURINOL 100 MG PO TABS
100.0000 mg | ORAL_TABLET | Freq: Every day | ORAL | 2 refills | Status: DC
  Filled 2021-03-23: qty 90, 90d supply, fill #0
  Filled 2021-08-17: qty 90, 90d supply, fill #1
  Filled 2021-12-01: qty 90, 90d supply, fill #2

## 2021-03-23 NOTE — Telephone Encounter (Signed)
  Prescription Request  03/23/2021  What is the name of the medication or equipment? Metformin and Rosuvastatin  Have you contacted your pharmacy to request a refill? (if applicable) Yes  Which pharmacy would you like this sent to? [facility]  Pt works for [employer] and needs all of his medicines sent to [facility]. Also says he is about to run out of his Metformin and Rosuvastatin Rx's.  Pt no longer using [facility].

## 2021-03-23 NOTE — Telephone Encounter (Signed)
Pt aware medications sent to pharmacy.

## 2021-03-25 ENCOUNTER — Other Ambulatory Visit (HOSPITAL_COMMUNITY): Payer: Self-pay

## 2021-03-31 ENCOUNTER — Ambulatory Visit (INDEPENDENT_AMBULATORY_CARE_PROVIDER_SITE_OTHER): Payer: 59 | Admitting: Family Medicine

## 2021-03-31 DIAGNOSIS — E119 Type 2 diabetes mellitus without complications: Secondary | ICD-10-CM

## 2021-03-31 DIAGNOSIS — F329 Major depressive disorder, single episode, unspecified: Secondary | ICD-10-CM | POA: Diagnosis not present

## 2021-03-31 DIAGNOSIS — E785 Hyperlipidemia, unspecified: Secondary | ICD-10-CM | POA: Diagnosis not present

## 2021-03-31 DIAGNOSIS — E1169 Type 2 diabetes mellitus with other specified complication: Secondary | ICD-10-CM

## 2021-03-31 NOTE — Progress Notes (Signed)
Telephone visit  Subjective: CC: DM PCP: Stephen Ip, DO Stephen Johnson is a 40 y.o. male calls for telephone consult today. Patient provides verbal consent for consult held via phone.  Due to COVID-19 pandemic this visit was conducted virtually. This visit type was conducted due to national recommendations for restrictions regarding the COVID-19 Pandemic (e.g. social distancing, sheltering in place) in an effort to limit this patient's exposure and mitigate transmission in our community. All issues noted in this document were discussed and addressed.  A physical exam was not performed with this format.   Location of patient: home Location of provider: [facility] Others present for call: none  1. New onset DM w/ HLD Patient was started on metformin extended release 500 mg daily and Ozempic.  He is currently injecting 0.5 mg every 7 days and notes that his BGs are running 100. he is feeling well on this.  Denies any nausea, vomiting, abdominal pain or diarrhea.  2. Reactive depression Reports that he is feeling better with the Lexapro.  He takes 1/2 Atarax every once in a while.  Sleep is better and Atarax helps.  He feels that he is adjusting to his situation.  ROS: Per HPI  Allergies  Allergen Reactions  . Penicillins     Has patient had a PCN reaction causing immediate rash, facial/tongue/throat swelling, SOB or lightheadedness with hypotension: Yes Has patient had a PCN reaction causing severe rash involving mucus membranes or skin necrosis: No Has patient had a PCN reaction that required hospitalization No Has patient had a PCN reaction occurring within the last 10 years: No If all of the above answers are "NO", then may proceed with Cephalosporin use.    No past medical history on file.  Current Outpatient Medications:  .  allopurinol (ZYLOPRIM) 100 MG tablet, Take 1 tablet (100 mg total) by mouth daily., Disp: 90 tablet, Rfl: 2 .  colchicine 0.6 MG tablet, 2 tabs of  the first sign of a flare followed by 1 tab 1 hour later, then take 1 tablet daily until the flare completely resolves, Disp: 30 tablet, Rfl: 3 .  escitalopram (LEXAPRO) 10 MG tablet, Take 1 tablet (10 mg total) by mouth daily., Disp: 30 tablet, Rfl: 1 .  hydrOXYzine (ATARAX/VISTARIL) 50 MG tablet, Take 1/2 - 1 tablet (25-50 mg total) by mouth 3  times daily as needed for anxiety., Disp: 30 tablet, Rfl: 0 .  metFORMIN (GLUCOPHAGE XR) 500 MG 24 hr tablet, Take 1 tablet (500 mg total) by mouth daily with breakfast., Disp: 90 tablet, Rfl: 3 .  rosuvastatin (CRESTOR) 5 MG tablet, Take 1 tablet (5 mg total) by mouth daily., Disp: 90 tablet, Rfl: 3 .  Semaglutide,0.25 or 0.5MG /DOS, (OZEMPIC, 0.25 OR 0.5 MG/DOSE,) 2 MG/1.5ML SOPN, Inject 0.5 mg into the skin once a week., Disp: 4.5 mL, Rfl: 3  Assessment/ Plan: 40 y.o. male   New onset type 2 diabetes mellitus (HCC) - Plan: Lipid panel, Bayer DCA Hb A1c Waived  Hyperlipidemia associated with type 2 diabetes mellitus (HCC) - Plan: Lipid panel  Reactive depression  It sounds like his sugar is getting under much better control and anticipate a normal A1c at next visit.  Future orders have been placed along with his fasting lipid panel to have collected prior to our next visit which is scheduled in May.  He sounds like he is doing well from a depression standpoint.  He may continue his current regimen.  No reported side effects  Start  time: 10:34 ("VM not set up", no answer); 10:35am  End time: 10:41pm  Total time spent on patient care (including telephone call/ virtual visit): 6 minutes  Rilea Arutyunyan Hulen Skains, DO [facility] [phone]

## 2021-04-10 ENCOUNTER — Other Ambulatory Visit (HOSPITAL_COMMUNITY): Payer: Self-pay

## 2021-04-22 DIAGNOSIS — H5213 Myopia, bilateral: Secondary | ICD-10-CM | POA: Diagnosis not present

## 2021-04-24 ENCOUNTER — Ambulatory Visit (INDEPENDENT_AMBULATORY_CARE_PROVIDER_SITE_OTHER): Payer: 59 | Admitting: Family Medicine

## 2021-04-24 ENCOUNTER — Other Ambulatory Visit: Payer: Self-pay

## 2021-04-24 ENCOUNTER — Other Ambulatory Visit: Payer: 59

## 2021-04-24 ENCOUNTER — Other Ambulatory Visit (HOSPITAL_COMMUNITY): Payer: Self-pay

## 2021-04-24 DIAGNOSIS — E1169 Type 2 diabetes mellitus with other specified complication: Secondary | ICD-10-CM | POA: Diagnosis not present

## 2021-04-24 DIAGNOSIS — F439 Reaction to severe stress, unspecified: Secondary | ICD-10-CM | POA: Diagnosis not present

## 2021-04-24 DIAGNOSIS — E119 Type 2 diabetes mellitus without complications: Secondary | ICD-10-CM | POA: Diagnosis not present

## 2021-04-24 DIAGNOSIS — E785 Hyperlipidemia, unspecified: Secondary | ICD-10-CM | POA: Diagnosis not present

## 2021-04-24 DIAGNOSIS — F419 Anxiety disorder, unspecified: Secondary | ICD-10-CM | POA: Diagnosis not present

## 2021-04-24 DIAGNOSIS — F329 Major depressive disorder, single episode, unspecified: Secondary | ICD-10-CM | POA: Diagnosis not present

## 2021-04-24 LAB — BAYER DCA HB A1C WAIVED: HB A1C (BAYER DCA - WAIVED): 5.4 % (ref <7.0)

## 2021-04-24 MED ORDER — ESCITALOPRAM OXALATE 10 MG PO TABS
10.0000 mg | ORAL_TABLET | Freq: Every day | ORAL | 3 refills | Status: DC
  Filled 2021-04-24: qty 90, 90d supply, fill #0

## 2021-04-24 NOTE — Progress Notes (Signed)
Telephone visit  Subjective: CC: DM PCP: Raliegh Ip, DO LXB:WIOMB R Trickey is a 40 y.o. male calls for telephone consult today. Patient provides verbal consent for consult held via phone.  Due to COVID-19 pandemic this visit was conducted virtually. This visit type was conducted due to national recommendations for restrictions regarding the COVID-19 Pandemic (e.g. social distancing, sheltering in place) in an effort to limit this patient's exposure and mitigate transmission in our community. All issues noted in this document were discussed and addressed.  A physical exam was not performed with this format.   Location of patient: work Location of provider: [facility] Others present for call: none  1. New onset DM with hyperlipidemia He is tolerating medication without difficulty.  Compliant with Ozempic and metformin.  He did not remember to get his weight checked today but overall he does feel better.  No significant change in eating habits but he is feeling well.  No chest pain, shortness of breath, nausea, vomiting, diarrhea or constipation reported.  Polydipsia and polyuria have improved   ROS: Per HPI  Allergies  Allergen Reactions  . Penicillins     Has patient had a PCN reaction causing immediate rash, facial/tongue/throat swelling, SOB or lightheadedness with hypotension: Yes Has patient had a PCN reaction causing severe rash involving mucus membranes or skin necrosis: No Has patient had a PCN reaction that required hospitalization No Has patient had a PCN reaction occurring within the last 10 years: No If all of the above answers are "NO", then may proceed with Cephalosporin use.    No past medical history on file.  Current Outpatient Medications:  .  allopurinol (ZYLOPRIM) 100 MG tablet, Take 1 tablet (100 mg total) by mouth daily., Disp: 90 tablet, Rfl: 2 .  colchicine 0.6 MG tablet, 2 tabs of the first sign of a flare followed by 1 tab 1 hour later, then take 1 tablet  daily until the flare completely resolves, Disp: 30 tablet, Rfl: 3 .  escitalopram (LEXAPRO) 10 MG tablet, Take 1 tablet (10 mg total) by mouth daily., Disp: 30 tablet, Rfl: 1 .  hydrOXYzine (ATARAX/VISTARIL) 50 MG tablet, Take 1/2 - 1 tablet (25-50 mg total) by mouth 3  times daily as needed for anxiety., Disp: 30 tablet, Rfl: 0 .  metFORMIN (GLUCOPHAGE XR) 500 MG 24 hr tablet, Take 1 tablet (500 mg total) by mouth daily with breakfast., Disp: 90 tablet, Rfl: 3 .  rosuvastatin (CRESTOR) 5 MG tablet, Take 1 tablet (5 mg total) by mouth daily., Disp: 90 tablet, Rfl: 3 .  Semaglutide,0.25 or 0.5MG /DOS, (OZEMPIC, 0.25 OR 0.5 MG/DOSE,) 2 MG/1.5ML SOPN, Inject 0.5 mg into the skin once a week., Disp: 4.5 mL, Rfl: 3  Lab Results  Component Value Date   HGBA1C 5.4 04/24/2021   Assessment/ Plan: 40 y.o. male   New onset type 2 diabetes mellitus (HCC)  Hyperlipidemia associated with type 2 diabetes mellitus (HCC)  Morbid obesity (HCC)  Reactive depression - Plan: escitalopram (LEXAPRO) 10 MG tablet  Anxiety - Plan: escitalopram (LEXAPRO) 10 MG tablet  Stress-related symptoms - Plan: escitalopram (LEXAPRO) 10 MG tablet  Continue current regimen with metformin and Ozempic.  No concerning symptoms or signs.  A1c looks fabulous today.  He has brought his A1c down from 7.4 to 5.4.  We can extend our visits out to 4 months.  Lipid panel is pending and we will contact him with those results once available.  He will weigh himself at his earliest convenience and  get back to me  Continue Lexapro.  Renewal sent.  Follow-up in 4 months, sooner if needed  Start time: 4:28pm End time: 4:33pm  Total time spent on patient care (including telephone call/ virtual visit): 5 minutes  Darrah Dredge Hulen Skains, DO [facility] [phone]

## 2021-04-25 LAB — LIPID PANEL
Chol/HDL Ratio: 3 ratio (ref 0.0–5.0)
Cholesterol, Total: 121 mg/dL (ref 100–199)
HDL: 40 mg/dL (ref >39)
LDL Chol Calc (NIH): 64 mg/dL (ref 0–99)
Triglycerides: 88 mg/dL (ref 0–149)
VLDL Cholesterol Cal: 17 mg/dL (ref 5–40)

## 2021-05-07 ENCOUNTER — Other Ambulatory Visit (HOSPITAL_COMMUNITY): Payer: Self-pay

## 2021-05-13 ENCOUNTER — Other Ambulatory Visit (HOSPITAL_COMMUNITY): Payer: Self-pay

## 2021-05-20 ENCOUNTER — Other Ambulatory Visit: Payer: Self-pay | Admitting: *Deleted

## 2021-05-20 ENCOUNTER — Other Ambulatory Visit: Payer: Self-pay | Admitting: Family

## 2021-05-20 ENCOUNTER — Other Ambulatory Visit (HOSPITAL_COMMUNITY): Payer: Self-pay

## 2021-05-20 ENCOUNTER — Encounter: Payer: Self-pay | Admitting: Family Medicine

## 2021-05-20 MED ORDER — BUSPIRONE HCL 5 MG PO TABS
5.0000 mg | ORAL_TABLET | Freq: Three times a day (TID) | ORAL | 2 refills | Status: DC | PRN
  Filled 2021-05-20: qty 90, 30d supply, fill #0

## 2021-05-20 MED ORDER — BUSPIRONE HCL 5 MG PO TABS: 5.0000 mg | ORAL_TABLET | Freq: Three times a day (TID) | ORAL | 2 refills | Status: DC | PRN

## 2021-05-21 ENCOUNTER — Other Ambulatory Visit (HOSPITAL_COMMUNITY): Payer: Self-pay

## 2021-05-28 ENCOUNTER — Telehealth: Payer: 59 | Admitting: Physician Assistant

## 2021-05-28 ENCOUNTER — Telehealth: Payer: Self-pay | Admitting: Family Medicine

## 2021-05-28 DIAGNOSIS — M109 Gout, unspecified: Secondary | ICD-10-CM | POA: Diagnosis not present

## 2021-05-28 MED ORDER — COLCHICINE 0.6 MG PO TABS: ORAL_TABLET | ORAL | 0 refills | Status: DC

## 2021-05-28 NOTE — Telephone Encounter (Signed)
Pt needs an appointment

## 2021-05-28 NOTE — Progress Notes (Signed)
I have spent 5 minutes in review of e-visit questionnaire, review and updating patient chart, medical decision making and response to patient.   Tymeshia Awan Cody Wednesday Ericsson, PA-C    

## 2021-05-28 NOTE — Progress Notes (Signed)
E-Visit for Gout  We are sorry that you are not feeling well. We are here to help!  Based on what you shared with me it looks like you have a flare of your gout.  Gout is a form of arthritis. It can cause pain and swelling in the joints. At first, it tends to affect only 1 joint - most frequently the big toe. It happens in people who have too much uric acid in the blood. Uric acid is a chemical that is produced when the body breaks down certain foods. Uric acid can form sharp needle-like crystals that build up in the joints and cause pain. Uric acid crystals can also form inside the tubes that carry urine from the kidneys to the bladder. These crystals can turn into "kidney stones" that can cause pain and problems with the flow of urine. People with gout get sudden "flares" or attacks of severe pain, most often the big toe, ankle, or knee. Often the joint also turns red and swells. Usually, only 1 joint is affected, but some people have pain in more than 1 joint. Gout flares tend to happen more often during the night.  The pain from gout can be extreme. The pain and swelling are worst at the beginning of a gout flare. The symptoms then get better within a few days to weeks. It is not clear how the body "turns off" a gout flare.  Do not start any NEW preventative medicine until the gout has cleared completely. However, If you are already on Probenecid or Allopurinol for CHRONIC gout, you may continue taking this during an active flare up  I have prescribed Colchicine 0.6 mg tabs - Take 2 tabs immediately, then 1 tab twice per day for the duration of the flare up to a max of 7 days (but discontinue for stomach pains or diarrhea)    HOME CARE Losing weight can help relieve gout. It's not clear that following a specific diet plan will help with gout symptoms but eating a balanced diet can help improve your overall health. It can also help you lose weight, if you are overweight. In general, a healthy diet  includes plenty of fruits, vegetables, whole grains, and low-fat dairy products (labelled "low fat", skim, 2%). Avoid sugar sweetened drinks (including sodas, tea, juice and juice blends, coffee drinks and sports drinks) Limit alcohol to 1-2 drinks of beer, spirits or wine daily these can make gout flares worse. Some people with gout also have other health problems, such as heart disease, high blood pressure, kidney disease, or obesity. If you have any of these issues, it's important to work with your doctor to manage them. This can help improve your overall health and might also help with your gout.  GET HELP RIGHT AWAY IF: Your symptoms persist after you have completed your treatment plan You develop severe diarrhea You develop abnormal sensations  You develop vomiting,   You develop weakness  You develop abdominal pain  FOLLOW UP WITH YOUR PRIMARY PROVIDER IF: If your symptoms do not improve within 10 days  MAKE SURE YOU  Understand these instructions. Will watch your condition. Will get help right away if you are not doing well or get worse.  Your e-visit answers were reviewed by a board certified advanced clinical practitioner to complete your personal care plan. Depending upon the condition, your plan could have included both over the counter or prescription medications.  Your safety is important to [place]. If you have drug allergies  safety is important to us. If you have drug allergies check your prescription carefully.   You can use MyChart to ask questions about today's visit, request a non-urgent call back, or ask for a work or school excuse for 24 hours related to this e-Visit. If it has been greater than 24 hours you will need to follow up with your provider, or enter a new e-Visit to address those concerns. You will get an e-mail with a link to a survey asking about your experience.  We hope that your e-visit has been valuable and will speed your recovery! Thank you for using e-visits.       

## 2021-05-28 NOTE — Telephone Encounter (Signed)
Patient notified - offered appt for tomorrow as televisit (no appt available or today) - patient states that he feels like he needs something sent in tonight - he will try doing an E-visit and if he is unable to he will call our office back.

## 2021-06-06 ENCOUNTER — Telehealth: Payer: 59 | Admitting: Nurse Practitioner

## 2021-06-06 ENCOUNTER — Other Ambulatory Visit (HOSPITAL_COMMUNITY): Payer: Self-pay

## 2021-06-06 DIAGNOSIS — J0100 Acute maxillary sinusitis, unspecified: Secondary | ICD-10-CM | POA: Diagnosis not present

## 2021-06-06 MED ORDER — DOXYCYCLINE HYCLATE 100 MG PO TABS
100.0000 mg | ORAL_TABLET | Freq: Two times a day (BID) | ORAL | 0 refills | Status: DC
  Filled 2021-06-06: qty 20, 10d supply, fill #0

## 2021-06-06 NOTE — Progress Notes (Signed)

## 2021-06-15 ENCOUNTER — Other Ambulatory Visit (HOSPITAL_COMMUNITY): Payer: Self-pay

## 2021-06-21 ENCOUNTER — Telehealth: Payer: 59 | Admitting: Physician Assistant

## 2021-06-21 DIAGNOSIS — L255 Unspecified contact dermatitis due to plants, except food: Secondary | ICD-10-CM

## 2021-06-21 MED ORDER — PREDNISONE 10 MG PO TABS: ORAL_TABLET | ORAL | 0 refills | Status: DC

## 2021-06-21 NOTE — Progress Notes (Signed)
E-Visit for Poison Ivy  We are sorry that you are not feeing well.  Here is how we plan to help!  Based on what you have shared with me it looks like you have had an allergic reaction to the oily resin from a group of plants.  This resin is very sticky, so it easily attaches to your skin, clothing, tools equipment, and pet's fur.    This blistering rash is often called poison ivy rash although it can come from contact with the leaves, stems and roots of poison ivy, poison oak and poison sumac.  The oily resin contains urushiol (u-ROO-she-ol) that produces a skin rash on exposed skin.  The severity of the rash depends on the amount of urushiol that gets on your skin.  A section of skin with more urushiol on it may develop a rash sooner.  The rash usually develops 12-48 hours after exposure and can last two to three weeks.  Your skin must come in direct contact with the plant's oil to be affected.  Blister fluid doesn't spread the rash.  However, if you come into contact with a piece of clothing or pet fur that has urushiol on it, the rash may spread out.  You can also transfer the oil to other parts of your body with your fingers.  Often the rash looks like a straight line because of the way the plant brushes against your skin.  Since your rash is widespread or has resulted in a large number of blisters, I have prescribed an oral corticosteroid.  Please follow these recommendations:  I have sent a prednisone dose pack to your chosen pharmacy. Be sure to follow the instructions carefully and complete the entire prescription. You may use Benadryl or Caladryl topical lotions to sooth the itch and remember cool, not hot, showers and baths can help relieve the itching!  Place cool, wet compresses on the affected area for 15-30 minutes several times a day.  You may also take oral antihistamines, such as diphenhydramine (Benadryl, others), which may also help you sleep better.  Watch your skin for any purulent  (pus) drainage or red streaking from the site.  If this occurs, contact your provider.  You may require an antibiotic for a skin infection.  Make sure that the clothes you were wearing as well as any towels or sheets that may have come in contact with the oil (urushiol) are washed in detergent and hot water.       I have developed the following plan to treat your condition I am prescribing a two week course of steroids (37 tablets of 10 mg prednisone).  Days 1-4 take 4 tablets (40 mg) daily  Days 5-8 take 3 tablets (30 mg) daily, Days 9-11 take 2 tablets (20 mg) daily, Days 12-14 take 1 tablet (10 mg) daily.    What can you do to prevent this rash?  Avoid the plants.  Learn how to identify poison ivy, poison oak and poison sumac in all seasons.  When hiking or engaging in other activities that might expose you to these plants, try to stay on cleared pathways.  If camping, make sure you pitch your tent in an area free of these plants.  Keep pets from running through wooded areas so that urushiol doesn't accidentally stick to their fur, which you may touch.  Remove or kill the plants.  In your yard, you can get rid of poison ivy by applying an herbicide or pulling it out of   the ground, including the roots, while wearing heavy gloves.  Afterward remove the gloves and thoroughly wash them and your hands.  Don't burn poison ivy or related plants because the urushiol can be carried by smoke.  Wear protective clothing.  If needed, protect your skin by wearing socks, boots, pants, long sleeves and vinyl gloves.  Wash your skin right away.  Washing off the oil with soap and water within 30 minutes of exposure may reduce your chances of getting a poison ivy rash.  Even washing after an hour or so can help reduce the severity of the rash.  If you walk through some poison ivy and then later touch your shoes, you may get some urushiol on your hands, which may then transfer to your face or body by touching or  rubbing.  If the contaminated object isn't cleaned, the urushiol on it can still cause a skin reaction years later.    Be careful not to reuse towels after you have washed your skin.  Also carefully wash clothing in detergent and hot water to remove all traces of the oil.  Handle contaminated clothing carefully so you don't transfer the urushiol to yourself, furniture, rugs or appliances.  Remember that pets can carry the oil on their fur and paws.  If you think your pet may be contaminated with urushiol, put on some long rubber gloves and give your pet a bath.  Finally, be careful not to burn these plants as the smoke can contain traces of the oil.  Inhaling the smoke may result in difficulty breathing. If that occurred you should see a physician as soon as possible.  See your doctor right away if:  The reaction is severe or widespread You inhaled the smoke from burning poison ivy and are having difficulty breathing Your skin continues to swell The rash affects your eyes, mouth or genitals Blisters are oozing pus You develop a fever greater than 100 F (37.8 C) The rash doesn't get better within a few weeks.  If you scratch the poison ivy rash, bacteria under your fingernails may cause the skin to become infected.  See your doctor if pus starts oozing from the blisters.  Treatment generally includes antibiotics.  Poison ivy treatments are usually limited to self-care methods.  And the rash typically goes away on its own in two to three weeks.     If the rash is widespread or results in a large number of blisters, your doctor may prescribe an oral corticosteroid, such as prednisone.  If a bacterial infection has developed at the rash site, your doctor may give you a prescription for an oral antibiotic.  MAKE SURE YOU  Understand these instructions. Will watch your condition. Will get help right away if you are not doing well or get worse.   Thank you for choosing an e-visit.  Your  e-visit answers were reviewed by a board certified advanced clinical practitioner to complete your personal care plan. Depending upon the condition, your plan could have included both over the counter or prescription medications.  Please review your pharmacy choice. Make sure the pharmacy is open so you can pick up prescription now. If there is a problem, you may contact your provider through MyChart messaging and have the prescription routed to another pharmacy.  Your safety is important to us. If you have drug allergies check your prescription carefully.   For the next 24 hours you can use MyChart to ask questions about today's visit, request a non-urgent   call back, or ask for a work or school excuse. You will get an email in the next two days asking about your experience. I hope that your e-visit has been valuable and will speed your recovery.   I provided 5 minutes of non face-to-face time during this encounter for chart review and documentation.   

## 2021-07-11 ENCOUNTER — Other Ambulatory Visit (HOSPITAL_COMMUNITY): Payer: Self-pay

## 2021-07-16 ENCOUNTER — Telehealth: Payer: Self-pay | Admitting: Family Medicine

## 2021-07-16 ENCOUNTER — Other Ambulatory Visit: Payer: Self-pay

## 2021-07-16 ENCOUNTER — Other Ambulatory Visit (HOSPITAL_COMMUNITY): Payer: Self-pay

## 2021-07-16 MED ORDER — COLCHICINE 0.6 MG PO TABS
ORAL_TABLET | ORAL | 0 refills | Status: DC
  Filled 2021-07-16: qty 10, 6d supply, fill #0

## 2021-07-16 NOTE — Telephone Encounter (Signed)
One refill sent 

## 2021-07-16 NOTE — Telephone Encounter (Signed)
  Prescription Request  07/16/2021  What is the name of the medication or equipment? Colchicine 0.6 mg  Have you contacted your pharmacy to request a refill? (if applicable) NO  Which pharmacy would you like this sent to? [facility]   Patient notified that their request is being sent to the clinical staff for review and that they should receive a response within 2 business days.

## 2021-08-17 ENCOUNTER — Other Ambulatory Visit (HOSPITAL_COMMUNITY): Payer: Self-pay

## 2021-09-28 ENCOUNTER — Other Ambulatory Visit (HOSPITAL_COMMUNITY): Payer: Self-pay

## 2021-09-28 ENCOUNTER — Other Ambulatory Visit: Payer: Self-pay | Admitting: Physician Assistant

## 2021-09-28 ENCOUNTER — Encounter: Payer: Self-pay | Admitting: Family Medicine

## 2021-09-28 DIAGNOSIS — L255 Unspecified contact dermatitis due to plants, except food: Secondary | ICD-10-CM

## 2021-09-29 ENCOUNTER — Telehealth: Payer: Self-pay | Admitting: Family Medicine

## 2021-09-29 NOTE — Telephone Encounter (Signed)
Patient is OVERDUE for appt.  He is a new diabetic and really should have been seen in September.  I'm glad to send in a new dose but he needs follow up first to check A1c/ labs

## 2021-09-29 NOTE — Telephone Encounter (Signed)
NA

## 2021-09-29 NOTE — Telephone Encounter (Signed)
Pt needs a follow up with Dr Nadine Counts for a change in medication. Her first opening is 12/6 - offered pt this appt and he said that something needed to be done before then and asked to put a message in. Please call back and advise.

## 2021-09-30 ENCOUNTER — Other Ambulatory Visit (HOSPITAL_COMMUNITY): Payer: Self-pay

## 2021-10-05 NOTE — Telephone Encounter (Signed)
Patient has appointment scheduled 10/19/21

## 2021-10-06 ENCOUNTER — Ambulatory Visit: Payer: 59 | Admitting: Family Medicine

## 2021-10-19 ENCOUNTER — Other Ambulatory Visit (HOSPITAL_COMMUNITY): Payer: Self-pay

## 2021-10-19 ENCOUNTER — Other Ambulatory Visit: Payer: Self-pay

## 2021-10-19 ENCOUNTER — Ambulatory Visit: Payer: 59 | Admitting: Family Medicine

## 2021-10-19 ENCOUNTER — Encounter: Payer: Self-pay | Admitting: Family Medicine

## 2021-10-19 VITALS — BP 117/75 | HR 79 | Temp 97.7°F | Ht 69.0 in | Wt 206.8 lb

## 2021-10-19 DIAGNOSIS — G4726 Circadian rhythm sleep disorder, shift work type: Secondary | ICD-10-CM

## 2021-10-19 DIAGNOSIS — E119 Type 2 diabetes mellitus without complications: Secondary | ICD-10-CM | POA: Diagnosis not present

## 2021-10-19 LAB — BAYER DCA HB A1C WAIVED: HB A1C (BAYER DCA - WAIVED): 4.5 % — ABNORMAL LOW (ref 4.8–5.6)

## 2021-10-19 MED ORDER — TRAZODONE HCL 50 MG PO TABS
25.0000 mg | ORAL_TABLET | Freq: Every evening | ORAL | 3 refills | Status: DC | PRN
Start: 2021-10-19 — End: 2022-02-16
  Filled 2021-10-19: qty 30, 30d supply, fill #0

## 2021-10-19 NOTE — Patient Instructions (Signed)
I'll consult the sleep medicine doctor about the Provigil.  Not sure the utility of that without sleep apnea/ narcolepsy diagnosis  Sugar looks fantastic.  Ok to STOP metformin.  Ok to continue the [employer] for weight maintenance/ sugar control.   See me back in 6 months for full physical with fasting labs

## 2021-10-19 NOTE — Progress Notes (Signed)
Subjective: CC:DM PCP: Raliegh Ip, DO NID:POEUM Stephen Johnson is a 40 y.o. male presenting to clinic today for:  1. Type 2 Diabetes  New onset earlier this year.  He was placed on Ozempic and metformin.  Blood sugar at last check was well controlled.  He is not taking Crestor.  He denies any GI side effects from the Ozempic and has had about a 70 pound weight loss since starting it.  He feels that he is able to breathe better.  Last eye exam: Up-to-date Last foot exam: Up-to-date Last A1c:  Lab Results  Component Value Date   HGBA1C 5.4 04/24/2021   Nephropathy screen indicated?:  Needs Last flu, zoster and/or pneumovax:  Immunization History  Administered Date(s) Administered   Influenza-Unspecified 09/25/2020    ROS: No chest pain, shortness of breath  2.  Sleep disorder Patient reports that Atarax was helpful in keeping him asleep but he felt excessively sedated after despite having gotten 8 hours of sleep.  He is interested in trying something else as he continues to have some shift changes depending on where he is working.  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  His friend at work and uses Provigil and he wondered if this would be appropriate for him.  He admits to drinking a lot of energy drinks try and stay awake.   ROS: Per HPI  Allergies  Allergen Reactions   Penicillins     Has patient had a PCN reaction causing immediate rash, facial/tongue/throat swelling, SOB or lightheadedness with hypotension: Yes Has patient had a PCN reaction causing severe rash involving mucus membranes or skin necrosis: No Has patient had a PCN reaction that required hospitalization No Has patient had a PCN reaction occurring within the last 10 years: No If all of the above answers are "NO", then may proceed with Cephalosporin use.    History reviewed. No pertinent past medical history.  Current Outpatient Medications:    allopurinol (ZYLOPRIM) 100 MG tablet, Take 1 tablet  (100 mg total) by mouth daily., Disp: 90 tablet, Rfl: 2   colchicine 0.6 MG tablet, Take 2 tablets by mouth at the first sign of a flare followed by 1 tablet 1 hour later, then take 1 tablet daily until the flare completely resolves, Disp: 10 tablet, Rfl: 0   metFORMIN (GLUCOPHAGE XR) 500 MG 24 hr tablet, Take 1 tablet (500 mg total) by mouth daily with breakfast., Disp: 90 tablet, Rfl: 3   Semaglutide,0.25 or 0.5MG /DOS, (OZEMPIC, 0.25 OR 0.5 MG/DOSE,) 2 MG/1.5ML SOPN, Inject 0.5 mg into the skin once a week., Disp: 4.5 mL, Rfl: 3 Social History   Socioeconomic History   Marital status: Single    Spouse name: Not on file   Number of children: Not on file   Years of education: Not on file   Highest education level: Not on file  Occupational History   Not on file  Tobacco Use   Smoking status: Never   Smokeless tobacco: Not on file  Substance and Sexual Activity   Alcohol use: No   Drug use: No   Sexual activity: Not on file  Other Topics Concern   Not on file  Social History Narrative   Not on file   Social Determinants of Health   Financial Resource Strain: Not on file  Food Insecurity: Not on file  Transportation Needs: Not on file  Physical Activity: Not on file  Stress: Not on file  Social Connections: Not on file  Intimate Partner Violence: Not on file   Family History  Problem Relation Age of Onset   Diabetes Mother     Objective: Office vital signs reviewed. BP 117/75   Pulse 79   Temp 97.7 F (36.5 C)   Ht 5\' 9"  (1.753 m)   Wt 206 lb 12.8 oz (93.8 kg)   BMI 30.54 kg/m   Physical Examination:  General: Awake, alert, well nourished, No acute distress HEENT: Normal, sclera white, MMM Cardio: regular rate and rhythm, S1S2 heard, no murmurs appreciated Pulm: clear to auscultation bilaterally, no wheezes, rhonchi or rales; normal work of breathing on room air GI: soft, non-tender, non-distended, bowel sounds present x4, no hepatomegaly, no splenomegaly, no  masses Extremities: warm, well perfused, No edema, cyanosis or clubbing; +2 pulses bilaterally MSK: normal gait and station  Assessment/ Plan: 40 y.o. male   Controlled type 2 diabetes mellitus without complication, without long-term current use of insulin (HCC) - Plan: Bayer DCA Hb A1c Waived, Microalbumin / creatinine urine ratio  Shifting sleep-work schedule - Plan: traZODone (DESYREL) 50 MG tablet  DM well controlled.  Stop metformin. Plan microalbumin at next visit.  May continue Ozempic for sugar and weight maintenance  Sleep is not well controlled.  Can try trazodone but he inquired about Provigil.  I am not aware of this being utilized outside of narcolepsy and or some complications of obstructive sleep apnea but I have reached out to the [facility] [facility] specialist for further advice.  We will contact patient pending that conversation  Otherwise we will plan to see him back in 6 months for full physical exam with fasting labs  Orders Placed This Encounter  Procedures   Bayer DCA Hb A1c Waived   Microalbumin / creatinine urine ratio   No orders of the defined types were placed in this encounter.    41, DO [facility] [phone]

## 2021-11-03 ENCOUNTER — Encounter: Payer: Self-pay | Admitting: Family Medicine

## 2021-11-06 ENCOUNTER — Other Ambulatory Visit: Payer: Self-pay | Admitting: Family Medicine

## 2021-11-06 DIAGNOSIS — G479 Sleep disorder, unspecified: Secondary | ICD-10-CM

## 2021-11-09 ENCOUNTER — Other Ambulatory Visit (HOSPITAL_COMMUNITY): Payer: Self-pay

## 2021-12-01 ENCOUNTER — Other Ambulatory Visit (HOSPITAL_COMMUNITY): Payer: Self-pay

## 2021-12-04 ENCOUNTER — Other Ambulatory Visit (HOSPITAL_COMMUNITY): Payer: Self-pay

## 2022-01-08 ENCOUNTER — Other Ambulatory Visit (HOSPITAL_COMMUNITY): Payer: Self-pay

## 2022-01-08 ENCOUNTER — Other Ambulatory Visit: Payer: Self-pay | Admitting: Family Medicine

## 2022-01-08 DIAGNOSIS — E785 Hyperlipidemia, unspecified: Secondary | ICD-10-CM

## 2022-01-08 DIAGNOSIS — E1169 Type 2 diabetes mellitus with other specified complication: Secondary | ICD-10-CM

## 2022-01-08 DIAGNOSIS — E119 Type 2 diabetes mellitus without complications: Secondary | ICD-10-CM

## 2022-01-08 MED ORDER — ROSUVASTATIN CALCIUM 5 MG PO TABS
5.0000 mg | ORAL_TABLET | Freq: Every day | ORAL | 3 refills | Status: DC
  Filled 2022-01-08: qty 90, 90d supply, fill #0
  Filled 2022-05-29: qty 90, 90d supply, fill #1
  Filled 2022-11-04: qty 90, 90d supply, fill #2

## 2022-01-09 ENCOUNTER — Other Ambulatory Visit: Payer: Self-pay | Admitting: Family Medicine

## 2022-01-12 ENCOUNTER — Telehealth: Payer: Self-pay | Admitting: Neurology

## 2022-01-12 NOTE — Telephone Encounter (Signed)
Pt called in wishing to reschedule his 2/2 sleep consult with Dr. Frances Furbish. Offered pt next available appointment of 2/7, to which pt stated he could not do either. Informed pt that the next available after that would be 3/27. Pt became upset and stated that it was "ridiculous" that we could not r/s the appointment that he chose to change sooner. Pt stated he would find care elsewhere and disconnected the call.

## 2022-01-18 ENCOUNTER — Telehealth: Payer: 59 | Admitting: Physician Assistant

## 2022-01-18 ENCOUNTER — Other Ambulatory Visit (HOSPITAL_COMMUNITY): Payer: Self-pay

## 2022-01-18 DIAGNOSIS — M10071 Idiopathic gout, right ankle and foot: Secondary | ICD-10-CM

## 2022-01-18 MED ORDER — PREDNISONE 10 MG (21) PO TBPK
ORAL_TABLET | ORAL | 0 refills | Status: DC
  Filled 2022-01-18: qty 21, 6d supply, fill #0

## 2022-01-18 MED ORDER — COLCHICINE 0.6 MG PO TABS
ORAL_TABLET | ORAL | 0 refills | Status: DC
Start: 2022-01-18 — End: 2023-07-27
  Filled 2022-01-18: qty 14, 6d supply, fill #0

## 2022-01-18 NOTE — Progress Notes (Signed)
E-Visit for Gout Symptoms  We are sorry that you are not feeling well. We are here to help!  Based on what you shared with me it looks like you have a flare of your gout.  Gout is a form of arthritis. It can cause pain and swelling in the joints. At first, it tends to affect only 1 joint - most frequently the big toe. It happens in people who have too much uric acid in the blood. Uric acid is a chemical that is produced when the body breaks down certain foods. Uric acid can form sharp needle-like crystals that build up in the joints and cause pain. Uric acid crystals can also form inside the tubes that carry urine from the kidneys to the bladder. These crystals can turn into "kidney stones" that can cause pain and problems with the flow of urine. People with gout get sudden "flares" or attacks of severe pain, most often the big toe, ankle, or knee. Often the joint also turns red and swells. Usually, only 1 joint is affected, but some people have pain in more than 1 joint. Gout flares tend to happen more often during the night.  The pain from gout can be extreme. The pain and swelling are worst at the beginning of a gout flare. The symptoms then get better within a few days to weeks. It is not clear how the body "turns off" a gout flare.  Do not start any NEW preventative medicine until the gout has cleared completely. However, If you are already on Probenecid or Allopurinol for CHRONIC gout, you may continue taking this during an active flare up  I have prescribed Colchicine 0.6 mg tabs - Take 2 tabs immediately, then 1 tab twice per day for the duration of the flare up to a max of 7 days (but discontinue for stomach pains or diarrhea)    HOME CARE Losing weight can help relieve gout. It's not clear that following a specific diet plan will help with gout symptoms but eating a balanced diet can help improve your overall health. It can also help you lose weight, if you are overweight. In general, a  healthy diet includes plenty of fruits, vegetables, whole grains, and low-fat dairy products (labelled "low fat", skim, 2%). Avoid sugar sweetened drinks (including sodas, tea, juice and juice blends, coffee drinks and sports drinks) Limit alcohol to 1-2 drinks of beer, spirits or wine daily these can make gout flares worse. Some people with gout also have other health problems, such as heart disease, high blood pressure, kidney disease, or obesity. If you have any of these issues, it's important to work with your doctor to manage them. This can help improve your overall health and might also help with your gout.  GET HELP RIGHT AWAY IF: Your symptoms persist after you have completed your treatment plan You develop severe diarrhea You develop abnormal sensations  You develop vomiting,   You develop weakness  You develop abdominal pain  FOLLOW UP WITH YOUR PRIMARY PROVIDER IF: If your symptoms do not improve within 10 days  MAKE SURE YOU  Understand these instructions. Will watch your condition. Will get help right away if you are not doing well or get worse.  Thank you for choosing an e-visit.  Your e-visit answers were reviewed by a board certified advanced clinical practitioner to complete your personal care plan. Depending upon the condition, your plan could have included both over the counter or prescription medications.  Please review your   pharmacy choice. Make sure the pharmacy is open so you can pick up prescription now. If there is a problem, you may contact your provider through MyChart messaging and have the prescription routed to another pharmacy.  Your safety is important to us. If you have drug allergies check your prescription carefully.   For the next 24 hours you can use MyChart to ask questions about today's visit, request a non-urgent call back, or ask for a work or school excuse. You will get an email in the next two days asking about your experience. I hope that your  e-visit has been valuable and will speed your recovery.  I provided 5 minutes of non face-to-face time during this encounter for chart review and documentation.   

## 2022-01-18 NOTE — Addendum Note (Signed)
Addended by: Margaretann Loveless on: 01/18/2022 07:35 AM   Modules accepted: Orders

## 2022-01-21 ENCOUNTER — Institutional Professional Consult (permissible substitution): Payer: 59 | Admitting: Neurology

## 2022-01-24 ENCOUNTER — Encounter: Payer: Self-pay | Admitting: Family Medicine

## 2022-02-08 ENCOUNTER — Ambulatory Visit: Payer: 59 | Admitting: Family Medicine

## 2022-02-09 ENCOUNTER — Other Ambulatory Visit (HOSPITAL_COMMUNITY): Payer: Self-pay

## 2022-02-16 ENCOUNTER — Other Ambulatory Visit (HOSPITAL_COMMUNITY): Payer: Self-pay

## 2022-02-16 ENCOUNTER — Encounter: Payer: Self-pay | Admitting: Family Medicine

## 2022-02-16 ENCOUNTER — Ambulatory Visit: Payer: 59 | Admitting: Family Medicine

## 2022-02-16 VITALS — BP 98/70 | HR 96 | Temp 98.2°F | Ht 69.0 in | Wt 203.8 lb

## 2022-02-16 DIAGNOSIS — E1169 Type 2 diabetes mellitus with other specified complication: Secondary | ICD-10-CM | POA: Diagnosis not present

## 2022-02-16 DIAGNOSIS — R4586 Emotional lability: Secondary | ICD-10-CM

## 2022-02-16 DIAGNOSIS — E785 Hyperlipidemia, unspecified: Secondary | ICD-10-CM | POA: Diagnosis not present

## 2022-02-16 DIAGNOSIS — E119 Type 2 diabetes mellitus without complications: Secondary | ICD-10-CM | POA: Diagnosis not present

## 2022-02-16 LAB — CMP14+EGFR
ALT: 19 IU/L (ref 0–44)
AST: 22 IU/L (ref 0–40)
Albumin/Globulin Ratio: 2.6 — ABNORMAL HIGH (ref 1.2–2.2)
Albumin: 5 g/dL (ref 4.0–5.0)
Alkaline Phosphatase: 80 IU/L (ref 44–121)
BUN/Creatinine Ratio: 14 (ref 9–20)
BUN: 16 mg/dL (ref 6–24)
Bilirubin Total: 0.5 mg/dL (ref 0.0–1.2)
CO2: 22 mmol/L (ref 20–29)
Calcium: 9.5 mg/dL (ref 8.7–10.2)
Chloride: 105 mmol/L (ref 96–106)
Creatinine, Ser: 1.12 mg/dL (ref 0.76–1.27)
Globulin, Total: 1.9 g/dL (ref 1.5–4.5)
Glucose: 89 mg/dL (ref 70–99)
Potassium: 4.3 mmol/L (ref 3.5–5.2)
Sodium: 140 mmol/L (ref 134–144)
Total Protein: 6.9 g/dL (ref 6.0–8.5)
eGFR: 85 mL/min/{1.73_m2} (ref >59)

## 2022-02-16 LAB — BAYER DCA HB A1C WAIVED: HB A1C (BAYER DCA - WAIVED): 4.6 % — ABNORMAL LOW (ref 4.8–5.6)

## 2022-02-16 MED ORDER — DEXCOM G6 SENSOR MISC
12 refills | Status: DC
  Filled 2022-02-16: qty 6, 60d supply, fill #0

## 2022-02-16 MED ORDER — BUPROPION HCL ER (XL) 150 MG PO TB24: 150.0000 mg | ORAL_TABLET | Freq: Every day | ORAL | 0 refills | Status: DC

## 2022-02-16 MED ORDER — DEXCOM G6 RECEIVER DEVI
0 refills | Status: DC
  Filled 2022-02-16: qty 1, fill #0

## 2022-02-16 MED ORDER — OZEMPIC (0.25 OR 0.5 MG/DOSE) 2 MG/1.5ML ~~LOC~~ SOPN
0.5000 mg | PEN_INJECTOR | SUBCUTANEOUS | 3 refills | Status: DC
  Filled 2022-02-16 – 2022-03-17 (×2): qty 4.5, 84d supply, fill #0
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
## 2022-03-02 ENCOUNTER — Other Ambulatory Visit (HOSPITAL_COMMUNITY): Payer: Self-pay

## 2022-03-03 ENCOUNTER — Other Ambulatory Visit: Payer: Self-pay

## 2022-03-03 ENCOUNTER — Other Ambulatory Visit (HOSPITAL_COMMUNITY): Payer: Self-pay

## 2022-03-04 ENCOUNTER — Telehealth: Payer: Self-pay

## 2022-03-04 NOTE — Telephone Encounter (Signed)
Key: [id number] - PA Case ID: [id number] ?drug ?Dexcom G6 Sensor ? ?Send to plan ?

## 2022-03-04 NOTE — Telephone Encounter (Signed)
Key: [id number] - PA Case ID: [id number] ?Drug ?Dexcom G6 Receiver device ? ?Send to plan ?

## 2022-03-12 ENCOUNTER — Other Ambulatory Visit (HOSPITAL_COMMUNITY): Payer: Self-pay

## 2022-03-15 ENCOUNTER — Other Ambulatory Visit: Payer: Self-pay | Admitting: Family Medicine

## 2022-03-15 DIAGNOSIS — R4586 Emotional lability: Secondary | ICD-10-CM

## 2022-03-17 ENCOUNTER — Other Ambulatory Visit: Payer: Self-pay | Admitting: Family Medicine

## 2022-03-17 ENCOUNTER — Other Ambulatory Visit (HOSPITAL_COMMUNITY): Payer: Self-pay

## 2022-03-17 MED ORDER — OZEMPIC (0.25 OR 0.5 MG/DOSE) 2 MG/3ML ~~LOC~~ SOPN
PEN_INJECTOR | SUBCUTANEOUS | 3 refills | Status: DC
  Filled 2022-03-17: qty 9, 84d supply, fill #0
  Filled 2022-05-29: qty 9, 84d supply, fill #1
  Filled 2022-08-10 – 2022-08-12 (×2): qty 9, 84d supply, fill #2
  Filled 2022-11-16: qty 9, 84d supply, fill #3

## 2022-03-17 MED FILL — Bupropion HCl Tab ER 24HR 150 MG: ORAL | 30 days supply | Qty: 30 | Fill #0 | Status: AC

## 2022-03-17 NOTE — Telephone Encounter (Signed)
The request has been approved. The authorization is effective for a maximum of 1 fills from 03/06/2022 to 03/06/2023, as long as the member is enrolled in their current health plan. The request was approved as submitted. This request has been approved for 1 receiver per 365 days. A written notification letter will follow with additional details. ?

## 2022-03-18 ENCOUNTER — Other Ambulatory Visit (HOSPITAL_COMMUNITY): Payer: Self-pay

## 2022-03-18 MED ORDER — ALLOPURINOL 100 MG PO TABS
100.0000 mg | ORAL_TABLET | Freq: Every day | ORAL | 0 refills | Status: DC
  Filled 2022-03-18: qty 90, 90d supply, fill #0

## 2022-03-18 NOTE — Telephone Encounter (Signed)
Sent other 90d from Jan that went to [place], looks like change of pharmacy to [facility] ?

## 2022-03-19 ENCOUNTER — Other Ambulatory Visit (HOSPITAL_COMMUNITY): Payer: Self-pay

## 2022-03-30 NOTE — Telephone Encounter (Signed)
Approvedon March 29 The request has been approved. The authorization is effective for a maximum of 1 fills from 03/06/2022 to 03/06/2023, as long as the member is enrolled in their current health plan. The request was approved as submitted. This request has been approved for 1 receiver per 365 days. A written notification letter will follow with additional details.

## 2022-04-12 ENCOUNTER — Telehealth: Payer: 59 | Admitting: Physician Assistant

## 2022-04-12 DIAGNOSIS — B9689 Other specified bacterial agents as the cause of diseases classified elsewhere: Secondary | ICD-10-CM

## 2022-04-12 DIAGNOSIS — J019 Acute sinusitis, unspecified: Secondary | ICD-10-CM | POA: Diagnosis not present

## 2022-04-12 MED ORDER — DOXYCYCLINE HYCLATE 100 MG PO TABS: 100.0000 mg | ORAL_TABLET | Freq: Two times a day (BID) | ORAL | 0 refills | Status: DC

## 2022-04-12 NOTE — Progress Notes (Signed)

## 2022-04-12 NOTE — Progress Notes (Signed)
I have spent 5 minutes in review of e-visit questionnaire, review and updating patient chart, medical decision making and response to patient.   Ytzel Gubler Cody Laelle Bridgett, PA-C    

## 2022-04-19 ENCOUNTER — Other Ambulatory Visit (HOSPITAL_COMMUNITY): Payer: Self-pay

## 2022-04-19 ENCOUNTER — Other Ambulatory Visit: Payer: Self-pay | Admitting: Family Medicine

## 2022-04-19 DIAGNOSIS — R4586 Emotional lability: Secondary | ICD-10-CM

## 2022-04-19 MED FILL — Bupropion HCl Tab ER 24HR 150 MG: ORAL | 30 days supply | Qty: 30 | Fill #1 | Status: CN

## 2022-04-20 ENCOUNTER — Encounter: Payer: 59 | Admitting: Family Medicine

## 2022-04-20 ENCOUNTER — Other Ambulatory Visit (HOSPITAL_COMMUNITY): Payer: Self-pay

## 2022-04-20 MED ORDER — BUPROPION HCL ER (XL) 150 MG PO TB24
ORAL_TABLET | ORAL | 0 refills | Status: DC
  Filled 2022-04-20: qty 90, 90d supply, fill #0

## 2022-04-26 ENCOUNTER — Other Ambulatory Visit (HOSPITAL_COMMUNITY): Payer: Self-pay

## 2022-05-28 ENCOUNTER — Other Ambulatory Visit (HOSPITAL_COMMUNITY): Payer: Self-pay

## 2022-05-29 ENCOUNTER — Other Ambulatory Visit: Payer: Self-pay

## 2022-05-29 ENCOUNTER — Telehealth: Payer: 59 | Admitting: Nurse Practitioner

## 2022-05-29 ENCOUNTER — Other Ambulatory Visit: Payer: Self-pay | Admitting: Family Medicine

## 2022-05-29 ENCOUNTER — Other Ambulatory Visit (HOSPITAL_COMMUNITY): Payer: Self-pay

## 2022-05-29 DIAGNOSIS — L247 Irritant contact dermatitis due to plants, except food: Secondary | ICD-10-CM

## 2022-05-29 DIAGNOSIS — R4586 Emotional lability: Secondary | ICD-10-CM

## 2022-05-29 MED ORDER — PREDNISONE 10 MG (21) PO TBPK
ORAL_TABLET | ORAL | 0 refills | Status: DC
  Filled 2022-05-29: qty 21, 6d supply, fill #0

## 2022-05-29 NOTE — Progress Notes (Signed)
E-Visit for [employer]  We are sorry that you are not feeing well.  Here is how we plan to help!  Based on what you have shared with me it looks like you have had an allergic reaction to the oily resin from a group of plants.  This resin is very sticky, so it easily attaches to your skin, clothing, tools equipment, and pet's fur.    This blistering rash is often called poison ivy rash although it can come from contact with the leaves, stems and roots of poison ivy, poison oak and poison sumac.  The oily resin contains urushiol (u-ROO-she-ol) that produces a skin rash on exposed skin.  The severity of the rash depends on the amount of urushiol that gets on your skin.  A section of skin with more urushiol on it may develop a rash sooner.  The rash usually develops 12-48 hours after exposure and can last two to three weeks.  Your skin must come in direct contact with the plant's oil to be affected.  Blister fluid doesn't spread the rash.  However, if you come into contact with a piece of clothing or pet fur that has urushiol on it, the rash may spread out.  You can also transfer the oil to other parts of your body with your fingers.  Often the rash looks like a straight line because of the way the plant brushes against your skin.  Since your rash is widespread or has resulted in a large number of blisters, I have prescribed an oral corticosteroid.  Please follow these recommendations:  I have sent a prednisone dose pack to your chosen pharmacy. Be sure to follow the instructions carefully and complete the entire prescription. You may use Benadryl or Caladryl topical lotions to sooth the itch and remember cool, not hot, showers and baths can help relieve the itching!  Place cool, wet compresses on the affected area for 15-30 minutes several times a day.  You may also take oral antihistamines, such as diphenhydramine (Benadryl, others), which may also help you sleep better.  Watch your skin for any purulent  (pus) drainage or red streaking from the site.  If this occurs, contact your provider.  You may require an antibiotic for a skin infection.  Make sure that the clothes you were wearing as well as any towels or sheets that may have come in contact with the oil (urushiol) are washed in detergent and hot water.       Directions for 6 day taper: Day 1: 2 tablets before breakfast, 1 after both lunch & dinner and 2 at bedtime Day 2: 1 tab before breakfast, 1 after both lunch & dinner and 2 at bedtime Day 3: 1 tab at each meal & 1 at bedtime Day 4: 1 tab at breakfast, 1 at lunch, 1 at bedtime Day 5: 1 tab at breakfast & 1 tab at bedtime Day 6: 1 tab at breakfast  I have developed the following plan to treat your condition    What can you do to prevent this rash?  Avoid the plants.  Learn how to identify poison ivy, poison oak and poison sumac in all seasons.  When hiking or engaging in other activities that might expose you to these plants, try to stay on cleared pathways.  If camping, make sure you pitch your tent in an area free of these plants.  Keep pets from running through wooded areas so that urushiol doesn't accidentally stick to their fur, which  you may touch.  Remove or kill the plants.  In your yard, you can get rid of poison ivy by applying an herbicide or pulling it out of the ground, including the roots, while wearing heavy gloves.  Afterward remove the gloves and thoroughly wash them and your hands.  Don't burn poison ivy or related plants because the urushiol can be carried by smoke.  Wear protective clothing.  If needed, protect your skin by wearing socks, boots, pants, long sleeves and vinyl gloves.  Wash your skin right away.  Washing off the oil with soap and water within 30 minutes of exposure may reduce your chances of getting a poison ivy rash.  Even washing after an hour or so can help reduce the severity of the rash.  If you walk through some poison ivy and then later touch  your shoes, you may get some urushiol on your hands, which may then transfer to your face or body by touching or rubbing.  If the contaminated object isn't cleaned, the urushiol on it can still cause a skin reaction years later.    Be careful not to reuse towels after you have washed your skin.  Also carefully wash clothing in detergent and hot water to remove all traces of the oil.  Handle contaminated clothing carefully so you don't transfer the urushiol to yourself, furniture, rugs or appliances.  Remember that pets can carry the oil on their fur and paws.  If you think your pet may be contaminated with urushiol, put on some long rubber gloves and give your pet a bath.  Finally, be careful not to burn these plants as the smoke can contain traces of the oil.  Inhaling the smoke may result in difficulty breathing. If that occurred you should see a physician as soon as possible.  See your doctor right away if:  The reaction is severe or widespread You inhaled the smoke from burning poison ivy and are having difficulty breathing Your skin continues to swell The rash affects your eyes, mouth or genitals Blisters are oozing pus You develop a fever greater than 100 F (37.8 C) The rash doesn't get better within a few weeks.  If you scratch the poison ivy rash, bacteria under your fingernails may cause the skin to become infected.  See your doctor if pus starts oozing from the blisters.  Treatment generally includes antibiotics.  Poison ivy treatments are usually limited to self-care methods.  And the rash typically goes away on its own in two to three weeks.     If the rash is widespread or results in a large number of blisters, your doctor may prescribe an oral corticosteroid, such as prednisone.  If a bacterial infection has developed at the rash site, your doctor may give you a prescription for an oral antibiotic.  MAKE SURE YOU  Understand these instructions. Will watch your  condition. Will get help right away if you are not doing well or get worse.   Thank you for choosing an e-visit.  Your e-visit answers were reviewed by a board certified advanced clinical practitioner to complete your personal care plan. Depending upon the condition, your plan could have included both over the counter or prescription medications.  Please review your pharmacy choice. Make sure the pharmacy is open so you can pick up prescription now. If there is a problem, you may contact your provider through [employer] and have the prescription routed to another pharmacy.  Your safety is important to [place].  If you have drug allergies check your prescription carefully.   For the next 24 hours you can use MyChart to ask questions about today's visit, request a non-urgent call back, or ask for a work or school excuse. You will get an email in the next two days asking about your experience. I hope that your e-visit has been valuable and will speed your recovery.   5-10 minutes spent reviewing and documenting in chart.

## 2022-05-31 ENCOUNTER — Other Ambulatory Visit (HOSPITAL_COMMUNITY): Payer: Self-pay

## 2022-05-31 MED ORDER — ALLOPURINOL 100 MG PO TABS
100.0000 mg | ORAL_TABLET | Freq: Every day | ORAL | 0 refills | Status: DC
  Filled 2022-05-31: qty 90, 90d supply, fill #0

## 2022-06-01 ENCOUNTER — Other Ambulatory Visit (HOSPITAL_COMMUNITY): Payer: Self-pay

## 2022-06-15 ENCOUNTER — Other Ambulatory Visit (HOSPITAL_COMMUNITY): Payer: Self-pay

## 2022-06-29 ENCOUNTER — Other Ambulatory Visit (HOSPITAL_COMMUNITY): Payer: Self-pay

## 2022-08-08 ENCOUNTER — Encounter: Payer: Self-pay | Admitting: Family Medicine

## 2022-08-10 ENCOUNTER — Other Ambulatory Visit (HOSPITAL_COMMUNITY): Payer: Self-pay

## 2022-08-10 ENCOUNTER — Other Ambulatory Visit: Payer: Self-pay | Admitting: Family Medicine

## 2022-08-10 DIAGNOSIS — R4586 Emotional lability: Secondary | ICD-10-CM

## 2022-08-11 ENCOUNTER — Encounter: Payer: Self-pay | Admitting: Family Medicine

## 2022-08-11 ENCOUNTER — Other Ambulatory Visit (HOSPITAL_COMMUNITY): Payer: Self-pay

## 2022-08-11 ENCOUNTER — Ambulatory Visit: Payer: 59 | Admitting: Family Medicine

## 2022-08-11 VITALS — BP 112/78 | HR 79 | Temp 97.9°F | Resp 20 | Ht 69.0 in | Wt 217.0 lb

## 2022-08-11 DIAGNOSIS — T24231A Burn of second degree of right lower leg, initial encounter: Secondary | ICD-10-CM | POA: Diagnosis not present

## 2022-08-11 DIAGNOSIS — F439 Reaction to severe stress, unspecified: Secondary | ICD-10-CM | POA: Diagnosis not present

## 2022-08-11 DIAGNOSIS — G479 Sleep disorder, unspecified: Secondary | ICD-10-CM

## 2022-08-11 DIAGNOSIS — F419 Anxiety disorder, unspecified: Secondary | ICD-10-CM | POA: Diagnosis not present

## 2022-08-11 DIAGNOSIS — E1169 Type 2 diabetes mellitus with other specified complication: Secondary | ICD-10-CM

## 2022-08-11 LAB — BAYER DCA HB A1C WAIVED: HB A1C (BAYER DCA - WAIVED): 4.9 % (ref 4.8–5.6)

## 2022-08-11 MED ORDER — TRAZODONE HCL 100 MG PO TABS
100.0000 mg | ORAL_TABLET | Freq: Every evening | ORAL | 3 refills | Status: DC | PRN
  Filled 2022-08-11: qty 180, 90d supply, fill #0
  Filled 2022-11-04: qty 180, 90d supply, fill #1

## 2022-08-11 MED ORDER — BUPROPION HCL ER (XL) 300 MG PO TB24
300.0000 mg | ORAL_TABLET | Freq: Every day | ORAL | 3 refills | Status: DC
  Filled 2022-08-11: qty 90, 90d supply, fill #0
  Filled 2022-11-04: qty 90, 90d supply, fill #1
  Filled 2023-03-02 (×3): qty 90, 90d supply, fill #2
  Filled 2023-06-13 – 2023-06-20 (×2): qty 90, 90d supply, fill #3

## 2022-08-11 MED ORDER — SILVER SULFADIAZINE 1 % EX CREA
1.0000 | TOPICAL_CREAM | Freq: Every day | CUTANEOUS | 0 refills | Status: DC
  Filled 2022-08-11: qty 50, 30d supply, fill #0

## 2022-08-11 MED ORDER — HYDROXYZINE PAMOATE 25 MG PO CAPS
25.0000 mg | ORAL_CAPSULE | Freq: Three times a day (TID) | ORAL | 1 refills | Status: DC | PRN
  Filled 2022-08-11: qty 90, 15d supply, fill #0
  Filled 2022-11-04: qty 90, 15d supply, fill #1
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
## 2022-08-12 ENCOUNTER — Other Ambulatory Visit (HOSPITAL_COMMUNITY): Payer: Self-pay

## 2022-08-13 ENCOUNTER — Other Ambulatory Visit (HOSPITAL_COMMUNITY): Payer: Self-pay

## 2022-08-16 ENCOUNTER — Telehealth: Payer: 59 | Admitting: Physician Assistant

## 2022-08-16 DIAGNOSIS — L089 Local infection of the skin and subcutaneous tissue, unspecified: Secondary | ICD-10-CM | POA: Diagnosis not present

## 2022-08-16 DIAGNOSIS — T300 Burn of unspecified body region, unspecified degree: Secondary | ICD-10-CM | POA: Diagnosis not present

## 2022-08-17 NOTE — Progress Notes (Signed)
E-VISIT for Burn  We are sorry that you are not feeling well. Here is how we plan to help!  Based on what you have shared with me it looks like you may have:  Your burn symptoms indicate that you should be evaluated in a face to face visit with a medical provider.  You should see your physician or go to one of the urgent cares I have listed (below). Giving signs/symptoms of more significant infection starting at burn site with the colored discharge, especially this far out from when burn occurred, I want you to have an in-person examination so they can obtain a wound culture to make sure most appropriate antibiotics and wound care are started.   Burns are a type of painful wound caused by thermal, electrical, chemical, or electromagnetic energy.  Smoking and open flame are the leading cause of burn injury for older adults.  Scalding from a hot liquid is the leading cause of burn injury for children.  Both infants and older adults are the greatest risk for burn injury.  First degree burns effect only the outer layers of the skin.  The burn may be red and painful but the skin does not blister.  Long term tissue damage is rare.  Second degree burns involve the surface of the skin and the adjacent skin layers.  The burn sire also appears red and painful and the skin often swells and/or blisters.  Third degree burns destroy both layers of the skin and can also penetrate to underlying  Structures.  A third degree burn may not initially hurt because nerve endings were destroyed.  All third degree burns should be evaluated in person.  HOME CARE:  Wash the area gently with soap and water once a day Apply antibiotic ointment directly to a Band-Aid or dressing and apply Band-Aid or dressing over the burn.  Change dressing every other day.  Use warm water and 1 or 2 wipes with a wet washcloth to remove any surface debris.  Some of the newer antibiotic ointments contain lidocaine that can help to control the  localized pain of the burn. You should leave intact blisters alone for the first 7 days.  After a week you may gently remove blisters.  The easiest way to do this is gently wipe away the dead skin with wet gauze or wet washcloth. If that fails you may carefully trim off the dead skin with a pair of fine scissors.  Be sure to clean the scissors in alcohol before use.  GET HELP RIGHT AWAY IF:  The area of the burn is larger than 4 palms of our hand. You become short of breath. The site looks infected. Your symptoms persist after you have completed your treatment plan. The burn has not healed in 14 days.   MAKE SURE YOU:  Understand these instructions. Will watch your condition. Will get help right away if you are not doing well or get worse.  Your e-visit answers were reviewed by a board certified advanced clinical practitioner to complete your personal care plan.  Depending upon the condition, your plan could have included both over the counter or prescription medications.    Please review your pharmacy choice.  Make sure the pharmacy is open so you can pick up prescription now.   If there is a problem, you may contact your provider through [employer] and have the prescription routed to another pharmacy. Your safety is important to [place].  If you have drug allergies check  your prescription carefully.    For the next 24 hours you can use MyChart to ask questions about today's visit, request a non-urgent call back, or ask for a work or school excuse.  You will get an email with a link to our survey asking about your experience.  I hope that your e-visit has been valuable and will speed your recovery.     Thank you for using e-Visits.

## 2022-10-16 ENCOUNTER — Other Ambulatory Visit (HOSPITAL_COMMUNITY): Payer: Self-pay

## 2022-10-19 ENCOUNTER — Other Ambulatory Visit: Payer: Self-pay | Admitting: Family Medicine

## 2022-10-19 ENCOUNTER — Other Ambulatory Visit (HOSPITAL_COMMUNITY): Payer: Self-pay

## 2022-10-19 ENCOUNTER — Telehealth: Payer: Self-pay | Admitting: Family Medicine

## 2022-10-19 MED ORDER — ALLOPURINOL 100 MG PO TABS: 100.0000 mg | ORAL_TABLET | Freq: Every day | ORAL | 0 refills | Status: DC

## 2022-10-19 MED ORDER — ALLOPURINOL 100 MG PO TABS
100.0000 mg | ORAL_TABLET | Freq: Every day | ORAL | 3 refills | Status: DC
Start: 2022-10-19 — End: 2022-10-19
  Filled 2022-10-19: qty 90, 90d supply, fill #0

## 2022-10-19 NOTE — Telephone Encounter (Signed)
I have no idea but am glad to send it.  [facility] or [facility]?

## 2022-10-19 NOTE — Telephone Encounter (Signed)
Pharmacy requested refill on 08/10/22 for pt's Allopurinol, these were denied pt NTBS, pt was seen on 08/11/22 They have no further refill requests.  OV 08/11/22 no Dx Last RF 05/31/22 Next OV NOT scheduled

## 2022-10-19 NOTE — Telephone Encounter (Signed)
90day supply sent to [facility] supply [facility] patient aware

## 2022-11-04 ENCOUNTER — Other Ambulatory Visit (HOSPITAL_COMMUNITY): Payer: Self-pay

## 2022-11-04 ENCOUNTER — Other Ambulatory Visit: Payer: Self-pay

## 2022-11-04 ENCOUNTER — Other Ambulatory Visit: Payer: Self-pay | Admitting: Family Medicine

## 2022-11-04 MED ORDER — ALLOPURINOL 100 MG PO TABS
100.0000 mg | ORAL_TABLET | Freq: Every day | ORAL | 0 refills | Status: DC
  Filled 2022-11-04: qty 30, 30d supply, fill #0

## 2022-11-05 ENCOUNTER — Other Ambulatory Visit: Payer: Self-pay | Admitting: Family Medicine

## 2022-11-05 ENCOUNTER — Other Ambulatory Visit (HOSPITAL_COMMUNITY): Payer: Self-pay

## 2022-11-05 MED ORDER — ALLOPURINOL 100 MG PO TABS
100.0000 mg | ORAL_TABLET | Freq: Every day | ORAL | 3 refills | Status: DC
  Filled 2022-11-05 – 2022-11-16 (×2): qty 90, 90d supply, fill #0
  Filled 2023-01-26: qty 90, 90d supply, fill #1
  Filled 2023-06-13 – 2023-06-20 (×2): qty 90, 90d supply, fill #2

## 2022-11-12 ENCOUNTER — Other Ambulatory Visit: Payer: Self-pay

## 2022-11-12 ENCOUNTER — Other Ambulatory Visit (HOSPITAL_COMMUNITY): Payer: Self-pay

## 2022-11-16 ENCOUNTER — Other Ambulatory Visit (HOSPITAL_COMMUNITY): Payer: Self-pay

## 2022-11-16 ENCOUNTER — Other Ambulatory Visit: Payer: Self-pay | Admitting: Family Medicine

## 2022-12-27 ENCOUNTER — Other Ambulatory Visit (HOSPITAL_COMMUNITY): Payer: Self-pay

## 2022-12-27 ENCOUNTER — Ambulatory Visit: Payer: Self-pay

## 2022-12-27 ENCOUNTER — Telehealth (INDEPENDENT_AMBULATORY_CARE_PROVIDER_SITE_OTHER): Payer: 59 | Admitting: Family Medicine

## 2022-12-27 ENCOUNTER — Telehealth: Payer: Commercial Managed Care - PPO | Admitting: Emergency Medicine

## 2022-12-27 ENCOUNTER — Encounter: Payer: Self-pay | Admitting: Family Medicine

## 2022-12-27 DIAGNOSIS — J069 Acute upper respiratory infection, unspecified: Secondary | ICD-10-CM | POA: Diagnosis not present

## 2022-12-27 DIAGNOSIS — Z0189 Encounter for other specified special examinations: Secondary | ICD-10-CM

## 2022-12-27 LAB — RSV AG, IMMUNOCHR, WAIVED: RSV Ag, Immunochr, Waived: NEGATIVE

## 2022-12-27 LAB — VERITOR FLU A/B WAIVED
Influenza A: NEGATIVE
Influenza B: NEGATIVE

## 2022-12-27 NOTE — Progress Notes (Signed)
We don't offer any lab tests through virtual visits.  If you'd like testing for covid, you can purchase and take a home test.  If you'd like testing for flu, you'll need to go to an urgent care, but be advised that many urgent care centers are out of tests.  If you take a home COVID test that is positive, you can do a video visit for treatment.  If the home covid test is negative, you can submit an e-visit for flu for treatment.  Because you'd like testing, I feel your condition warrants further evaluation and I recommend that you be seen in a face to face visit.   NOTE: There will be NO CHARGE for this eVisit   If you are having a true medical emergency please call 911.      For an urgent face to face visit, [facility] has seven urgent care centers for your convenience:     [facility] [place] [facility] [place] Get Driving Directions [phone] [address] [place], [place] [address]    [place] [facility]) Get Driving Directions [phone] [place], [place] [address]  [facility] [place] ([place]) Get Driving Directions [phone] [address] [place],  [place]  [address]  [place] [place] [facility] at [address] Get Driving Directions  [phone] [address] [place],  [place] [address]   [facility] Get Driving Directions [phone] [address] [place], [place], [place] [address]   [facility] Get Driving Directions  [phone] [address] [place], [place] [address]   [facility] [place] Get Driving Directions [phone] [address], [place], [place] [address]  Your MyChart E-visit questionnaire answers were reviewed by a board certified advanced clinical practitioner to complete your personal care  plan based on your specific symptoms.  Thank you for using e-Visits.    Approximately 5 minutes was used in reviewing the patient's chart, questionnaire, prescribing medications, and documentation.

## 2022-12-27 NOTE — Progress Notes (Signed)
Virtual Visit via telephone Note  I connected with Stephen Johnson on 12/27/22 at [phone] by telephone and verified that I am speaking with the correct person using two identifiers. Stephen Johnson is currently located at home and patient are currently with her during visit. The provider, Elige Radon Limmie Schoenberg, MD is located in their office at time of visit.  Call ended at [phone]  I discussed the limitations, risks, security and privacy concerns of performing an evaluation and management service by telephone and the availability of in person appointments. I also discussed with the patient that there may be a patient responsible charge related to this service. The patient expressed understanding and agreed to proceed.   History and Present Illness: Patient is calling in for 2 days of body aches and headaches but no cough or fever.  He is feeling a little better. He is using alternating tylenol and ibuprofen.  His headache is right side and front and now left.  He denies SOB.   1. Upper respiratory tract infection, unspecified type     Outpatient Encounter Medications as of 12/27/2022  Medication Sig   allopurinol (ZYLOPRIM) 100 MG tablet Take 1 tablet (100 mg total) by mouth daily.   buPROPion (WELLBUTRIN XL) 300 MG 24 hr tablet Take 1 tablet by mouth daily.   colchicine 0.6 MG tablet Take 2 tablets by mouth at the first sign of a flare followed by 1 tablet 1 hour later, then take 1 tablet daily until the flare completely resolves   Continuous Blood Gluc Receiver (DEXCOM G6 RECEIVER) DEVI Use as directed for blood sugar checks (Patient not taking: Reported on 08/11/2022)   Continuous Blood Gluc Sensor (DEXCOM G6 SENSOR) MISC Use as directed for blood sugar checks (Patient not taking: Reported on 08/11/2022)   hydrOXYzine (VISTARIL) 25 MG capsule Take 1 to 2 capsules by mouth every 8 hours as needed for anxiety (panic attacks).   rosuvastatin (CRESTOR) 5 MG tablet Take 1 tablet (5 mg total) by mouth  daily.   Semaglutide,0.25 or 0.5MG /DOS, (OZEMPIC, 0.25 OR 0.5 MG/DOSE,) 2 MG/3ML SOPN Inject 0.5 mg into the skin once a week   silver sulfADIAZINE (SSD) 1 % cream Apply topically daily   traZODone (DESYREL) 100 MG tablet Take 1 to 2 tablets by mouth at bedtime as needed for sleep.   No facility-administered encounter medications on file as of 12/27/2022.    Review of Systems  Constitutional:  Negative for chills and fever.  HENT:  Positive for congestion and sinus pressure. Negative for ear discharge, ear pain, postnasal drip, rhinorrhea, sneezing, sore throat and voice change.   Eyes:  Negative for pain, discharge, redness and visual disturbance.  Respiratory:  Negative for shortness of breath and wheezing.   Cardiovascular:  Negative for chest pain and leg swelling.  Musculoskeletal:  Positive for myalgias. Negative for gait problem.  Skin:  Negative for rash.  Neurological:  Positive for headaches.  All other systems reviewed and are negative.   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 Follow up plan: Return if symptoms worsen or fail to improve.     I discussed the assessment and treatment plan with the patient. The patient was provided an opportunity to ask questions and all were answered. The patient agreed with the plan and demonstrated an understanding of the instructions.   The patient was advised to call back or seek an in-person evaluation if the symptoms worsen  or if the condition fails to improve as anticipated.  The above assessment and management plan was discussed with the patient. The patient verbalized understanding of and has agreed to the management plan. Patient is aware to call the clinic if symptoms persist or worsen. Patient is aware when to return to the clinic for a follow-up visit. Patient educated on when it is appropriate to go to the emergency department.    I provided 8 minutes of non-face-to-face time during this encounter.    Worthy Rancher, MD

## 2022-12-28 LAB — NOVEL CORONAVIRUS, NAA: SARS-CoV-2, NAA: NOT DETECTED

## 2023-01-26 ENCOUNTER — Other Ambulatory Visit: Payer: Self-pay | Admitting: Family Medicine

## 2023-01-26 ENCOUNTER — Other Ambulatory Visit: Payer: Self-pay

## 2023-01-26 ENCOUNTER — Other Ambulatory Visit (HOSPITAL_COMMUNITY): Payer: Self-pay

## 2023-01-26 MED ORDER — OZEMPIC (0.25 OR 0.5 MG/DOSE) 2 MG/3ML ~~LOC~~ SOPN
0.5000 mg | PEN_INJECTOR | SUBCUTANEOUS | 3 refills | Status: DC
  Filled 2023-01-26: qty 3, 28d supply, fill #0
  Filled 2023-02-17: qty 9, 84d supply, fill #1
  Filled 2023-02-18: qty 3, 28d supply, fill #1
  Filled 2023-03-19: qty 3, 28d supply, fill #2
  Filled 2023-04-26 (×2): qty 9, 84d supply, fill #3
  Filled 2023-07-04 – 2023-07-14 (×2): qty 9, 84d supply, fill #4

## 2023-01-27 ENCOUNTER — Other Ambulatory Visit (HOSPITAL_COMMUNITY): Payer: Self-pay

## 2023-01-27 ENCOUNTER — Other Ambulatory Visit: Payer: Self-pay

## 2023-02-08 ENCOUNTER — Other Ambulatory Visit (HOSPITAL_COMMUNITY): Payer: Self-pay

## 2023-02-17 ENCOUNTER — Other Ambulatory Visit (HOSPITAL_COMMUNITY): Payer: Self-pay

## 2023-02-18 ENCOUNTER — Other Ambulatory Visit (HOSPITAL_COMMUNITY): Payer: Self-pay

## 2023-03-02 ENCOUNTER — Other Ambulatory Visit (HOSPITAL_COMMUNITY): Payer: Self-pay

## 2023-03-21 ENCOUNTER — Other Ambulatory Visit: Payer: Self-pay

## 2023-03-23 ENCOUNTER — Telehealth: Payer: 59 | Admitting: Nurse Practitioner

## 2023-03-23 DIAGNOSIS — L237 Allergic contact dermatitis due to plants, except food: Secondary | ICD-10-CM

## 2023-03-23 MED ORDER — PREDNISONE 10 MG PO TABS: ORAL_TABLET | ORAL | 0 refills | Status: DC

## 2023-03-23 NOTE — Progress Notes (Signed)
E-Visit for Poison Ivy  We are sorry that you are not feeing well.  Here is how we plan to help!  Based on what you have shared with me it looks like you have had an allergic reaction to the oily resin from a group of plants.  This resin is very sticky, so it easily attaches to your skin, clothing, tools equipment, and pet's fur.    This blistering rash is often called poison ivy rash although it can come from contact with the leaves, stems and roots of poison ivy, poison oak and poison sumac.  The oily resin contains urushiol (u-ROO-she-ol) that produces a skin rash on exposed skin.  The severity of the rash depends on the amount of urushiol that gets on your skin.  A section of skin with more urushiol on it may develop a rash sooner.  The rash usually develops 12-48 hours after exposure and can last two to three weeks.  Your skin must come in direct contact with the plant's oil to be affected.  Blister fluid doesn't spread the rash.  However, if you come into contact with a piece of clothing or pet fur that has urushiol on it, the rash may spread out.  You can also transfer the oil to other parts of your body with your fingers.  Often the rash looks like a straight line because of the way the plant brushes against your skin.  Since your rash is widespread or has resulted in a large number of blisters, I have prescribed an oral corticosteroid.  Please follow these recommendations:  I have sent a prednisone dose pack to your chosen pharmacy. Be sure to follow the instructions carefully and complete the entire prescription. You may use Benadryl or Caladryl topical lotions to sooth the itch and remember cool, not hot, showers and baths can help relieve the itching!  Place cool, wet compresses on the affected area for 15-30 minutes several times a day.  You may also take oral antihistamines, such as diphenhydramine (Benadryl, others), which may also help you sleep better.  Watch your skin for any purulent  (pus) drainage or red streaking from the site.  If this occurs, contact your provider.  You may require an antibiotic for a skin infection.  Make sure that the clothes you were wearing as well as any towels or sheets that may have come in contact with the oil (urushiol) are washed in detergent and hot water.       I have developed the following plan to treat your condition I am prescribing a two week course of steroids (37 tablets of 10 mg prednisone).  Days 1-4 take 4 tablets (40 mg) daily  Days 5-8 take 3 tablets (30 mg) daily, Days 9-11 take 2 tablets (20 mg) daily, Days 12-14 take 1 tablet (10 mg) daily.    What can you do to prevent this rash?  Avoid the plants.  Learn how to identify poison ivy, poison oak and poison sumac in all seasons.  When hiking or engaging in other activities that might expose you to these plants, try to stay on cleared pathways.  If camping, make sure you pitch your tent in an area free of these plants.  Keep pets from running through wooded areas so that urushiol doesn't accidentally stick to their fur, which you may touch.  Remove or kill the plants.  In your yard, you can get rid of poison ivy by applying an herbicide or pulling it out of   the ground, including the roots, while wearing heavy gloves.  Afterward remove the gloves and thoroughly wash them and your hands.  Don't burn poison ivy or related plants because the urushiol can be carried by smoke.  Wear protective clothing.  If needed, protect your skin by wearing socks, boots, pants, long sleeves and vinyl gloves.  Wash your skin right away.  Washing off the oil with soap and water within 30 minutes of exposure may reduce your chances of getting a poison ivy rash.  Even washing after an hour or so can help reduce the severity of the rash.  If you walk through some poison ivy and then later touch your shoes, you may get some urushiol on your hands, which may then transfer to your face or body by touching or  rubbing.  If the contaminated object isn't cleaned, the urushiol on it can still cause a skin reaction years later.    Be careful not to reuse towels after you have washed your skin.  Also carefully wash clothing in detergent and hot water to remove all traces of the oil.  Handle contaminated clothing carefully so you don't transfer the urushiol to yourself, furniture, rugs or appliances.  Remember that pets can carry the oil on their fur and paws.  If you think your pet may be contaminated with urushiol, put on some long rubber gloves and give your pet a bath.  Finally, be careful not to burn these plants as the smoke can contain traces of the oil.  Inhaling the smoke may result in difficulty breathing. If that occurred you should see a physician as soon as possible.  See your doctor right away if:  The reaction is severe or widespread You inhaled the smoke from burning poison ivy and are having difficulty breathing Your skin continues to swell The rash affects your eyes, mouth or genitals Blisters are oozing pus You develop a fever greater than 100 F (37.8 C) The rash doesn't get better within a few weeks.  If you scratch the poison ivy rash, bacteria under your fingernails may cause the skin to become infected.  See your doctor if pus starts oozing from the blisters.  Treatment generally includes antibiotics.  Poison ivy treatments are usually limited to self-care methods.  And the rash typically goes away on its own in two to three weeks.     If the rash is widespread or results in a large number of blisters, your doctor may prescribe an oral corticosteroid, such as prednisone.  If a bacterial infection has developed at the rash site, your doctor may give you a prescription for an oral antibiotic.  MAKE SURE YOU  Understand these instructions. Will watch your condition. Will get help right away if you are not doing well or get worse.   Thank you for choosing an e-visit.  Your  e-visit answers were reviewed by a board certified advanced clinical practitioner to complete your personal care plan. Depending upon the condition, your plan could have included both over the counter or prescription medications.  Please review your pharmacy choice. Make sure the pharmacy is open so you can pick up prescription now. If there is a problem, you may contact your provider through MyChart messaging and have the prescription routed to another pharmacy.  Your safety is important to us. If you have drug allergies check your prescription carefully.   For the next 24 hours you can use MyChart to ask questions about today's visit, request a non-urgent   call back, or ask for a work or school excuse. You will get an email in the next two days asking about your experience. I hope that your e-visit has been valuable and will speed your recovery.  Meds ordered this encounter  Medications   predniSONE (DELTASONE) 10 MG tablet    Sig: Take 4 tablets (40mg ) on days 1-4, then 3 tablets (30mg ) on days 5-8, then 2 tablets (20mg ) on days 9-11, then 1 tablet daily for days 12-14. Take with food.    Dispense:  37 tablet    Refill:  0    I spent approximately 5 minutes reviewing the patient's history, current symptoms and coordinating their care today.

## 2023-03-29 ENCOUNTER — Other Ambulatory Visit: Payer: Self-pay

## 2023-04-26 ENCOUNTER — Other Ambulatory Visit (HOSPITAL_COMMUNITY): Payer: Self-pay

## 2023-04-26 ENCOUNTER — Other Ambulatory Visit: Payer: Self-pay

## 2023-04-27 ENCOUNTER — Other Ambulatory Visit: Payer: Self-pay

## 2023-05-01 ENCOUNTER — Telehealth: Payer: 59 | Admitting: Family

## 2023-05-01 DIAGNOSIS — J019 Acute sinusitis, unspecified: Secondary | ICD-10-CM | POA: Diagnosis not present

## 2023-05-01 MED ORDER — DOXYCYCLINE HYCLATE 100 MG PO TABS: 100.0000 mg | ORAL_TABLET | Freq: Two times a day (BID) | ORAL | 0 refills | Status: DC

## 2023-05-01 NOTE — Progress Notes (Signed)

## 2023-05-07 ENCOUNTER — Other Ambulatory Visit (HOSPITAL_COMMUNITY): Payer: Self-pay

## 2023-05-27 ENCOUNTER — Telehealth: Payer: 59 | Admitting: Nurse Practitioner

## 2023-05-27 DIAGNOSIS — M1A0790 Idiopathic chronic gout, unspecified ankle and foot, without tophus (tophi): Secondary | ICD-10-CM

## 2023-05-27 MED ORDER — PREDNISONE 20 MG PO TABS: 40.0000 mg | ORAL_TABLET | Freq: Every day | ORAL | 0 refills | Status: AC

## 2023-05-27 NOTE — Progress Notes (Signed)
E-Visit for Gout Symptoms  We are sorry that you are not feeling well. We are here to help!  Based on what you shared with me it looks like you have a flare of your gout.  Gout is a form of arthritis. It can cause pain and swelling in the joints. At first, it tends to affect only 1 joint - most frequently the big toe. It happens in people who have too much uric acid in the blood. Uric acid is a chemical that is produced when the body breaks down certain foods. Uric acid can form sharp needle-like crystals that build up in the joints and cause pain. Uric acid crystals can also form inside the tubes that carry urine from the kidneys to the bladder. These crystals can turn into "kidney stones" that can cause pain and problems with the flow of urine. People with gout get sudden "flares" or attacks of severe pain, most often the big toe, ankle, or knee. Often the joint also turns red and swells. Usually, only 1 joint is affected, but some people have pain in more than 1 joint. Gout flares tend to happen more often during the night.  The pain from gout can be extreme. The pain and swelling are worst at the beginning of a gout flare. The symptoms then get better within a few days to weeks. It is not clear how the body "turns off" a gout flare.  Do not start any NEW preventative medicine until the gout has cleared completely. However, If you are already on Probenecid or Allopurinol for CHRONIC gout, you may continue taking this during an active flare up  I have prescribed Prednisone 40 mg daily for 7   HOME CARE Losing weight can help relieve gout. It's not clear that following a specific diet plan will help with gout symptoms but eating a balanced diet can help improve your overall health. It can also help you lose weight, if you are overweight. In general, a healthy diet includes plenty of fruits, vegetables, whole grains, and low-fat dairy products (labelled "low fat", skim, 2%). Avoid sugar sweetened  drinks (including sodas, tea, juice and juice blends, coffee drinks and sports drinks) Limit alcohol to 1-2 drinks of beer, spirits or wine daily these can make gout flares worse. Some people with gout also have other health problems, such as heart disease, high blood pressure, kidney disease, or obesity. If you have any of these issues, it's important to work with your doctor to manage them. This can help improve your overall health and might also help with your gout.  GET HELP RIGHT AWAY IF: Your symptoms persist after you have completed your treatment plan You develop severe diarrhea You develop abnormal sensations  You develop vomiting,   You develop weakness  You develop abdominal pain  FOLLOW UP WITH YOUR PRIMARY PROVIDER IF: If your symptoms do not improve within 10 days  MAKE SURE YOU  Understand these instructions. Will watch your condition. Will get help right away if you are not doing well or get worse.  Thank you for choosing an e-visit.  Your e-visit answers were reviewed by a board certified advanced clinical practitioner to complete your personal care plan. Depending upon the condition, your plan could have included both over the counter or prescription medications.  Please review your pharmacy choice. Make sure the pharmacy is open so you can pick up prescription now. If there is a problem, you may contact your provider through [employer] and have  the prescription routed to another pharmacy.  Your safety is important to [place]. If you have drug allergies check your prescription carefully.   For the next 24 hours you can use MyChart to ask questions about today's visit, request a non-urgent call back, or ask for a work or school excuse. You will get an email in the next two days asking about your experience. I hope that your e-visit has been valuable and will speed your recovery.   Meds ordered this encounter  Medications   predniSONE (DELTASONE) 20 MG tablet    Sig:  Take 2 tablets (40 mg total) by mouth daily with breakfast for 7 days.    Dispense:  14 tablet    Refill:  0    I spent approximately 5 minutes reviewing the patient's history, current symptoms and coordinating their care today.

## 2023-06-13 ENCOUNTER — Other Ambulatory Visit (HOSPITAL_COMMUNITY): Payer: Self-pay

## 2023-06-20 ENCOUNTER — Other Ambulatory Visit (HOSPITAL_COMMUNITY): Payer: Self-pay

## 2023-07-05 ENCOUNTER — Other Ambulatory Visit: Payer: Self-pay

## 2023-07-14 ENCOUNTER — Other Ambulatory Visit: Payer: Self-pay

## 2023-07-19 ENCOUNTER — Other Ambulatory Visit (HOSPITAL_COMMUNITY): Payer: Self-pay

## 2023-07-21 DEATH — deceased

## 2023-07-27 ENCOUNTER — Other Ambulatory Visit (HOSPITAL_COMMUNITY): Payer: Self-pay

## 2023-07-27 ENCOUNTER — Other Ambulatory Visit: Payer: Self-pay

## 2023-07-27 ENCOUNTER — Ambulatory Visit: Payer: 59 | Admitting: Family Medicine

## 2023-07-27 ENCOUNTER — Encounter: Payer: Self-pay | Admitting: Family Medicine

## 2023-07-27 VITALS — BP 122/80 | HR 96 | Temp 98.9°F | Ht 69.0 in | Wt 233.0 lb

## 2023-07-27 DIAGNOSIS — F439 Reaction to severe stress, unspecified: Secondary | ICD-10-CM | POA: Diagnosis not present

## 2023-07-27 DIAGNOSIS — Z114 Encounter for screening for human immunodeficiency virus [HIV]: Secondary | ICD-10-CM | POA: Diagnosis not present

## 2023-07-27 DIAGNOSIS — Z23 Encounter for immunization: Secondary | ICD-10-CM | POA: Diagnosis not present

## 2023-07-27 DIAGNOSIS — Z0001 Encounter for general adult medical examination with abnormal findings: Secondary | ICD-10-CM | POA: Diagnosis not present

## 2023-07-27 DIAGNOSIS — E1169 Type 2 diabetes mellitus with other specified complication: Secondary | ICD-10-CM

## 2023-07-27 DIAGNOSIS — E785 Hyperlipidemia, unspecified: Secondary | ICD-10-CM | POA: Diagnosis not present

## 2023-07-27 DIAGNOSIS — Z7985 Long-term (current) use of injectable non-insulin antidiabetic drugs: Secondary | ICD-10-CM

## 2023-07-27 DIAGNOSIS — E119 Type 2 diabetes mellitus without complications: Secondary | ICD-10-CM

## 2023-07-27 DIAGNOSIS — Z1159 Encounter for screening for other viral diseases: Secondary | ICD-10-CM

## 2023-07-27 DIAGNOSIS — Z125 Encounter for screening for malignant neoplasm of prostate: Secondary | ICD-10-CM | POA: Diagnosis not present

## 2023-07-27 DIAGNOSIS — G479 Sleep disorder, unspecified: Secondary | ICD-10-CM | POA: Diagnosis not present

## 2023-07-27 DIAGNOSIS — M1A0710 Idiopathic chronic gout, right ankle and foot, without tophus (tophi): Secondary | ICD-10-CM | POA: Diagnosis not present

## 2023-07-27 DIAGNOSIS — Z0000 Encounter for general adult medical examination without abnormal findings: Secondary | ICD-10-CM

## 2023-07-27 LAB — BAYER DCA HB A1C WAIVED: HB A1C (BAYER DCA - WAIVED): 4.6 % — ABNORMAL LOW (ref 4.8–5.6)

## 2023-07-27 MED ORDER — OZEMPIC (0.25 OR 0.5 MG/DOSE) 2 MG/3ML ~~LOC~~ SOPN
0.5000 mg | PEN_INJECTOR | SUBCUTANEOUS | 3 refills | Status: DC
  Filled 2023-07-27: qty 9, 84d supply, fill #0

## 2023-07-27 MED ORDER — TRAZODONE HCL 50 MG PO TABS
25.0000 mg | ORAL_TABLET | Freq: Every evening | ORAL | 99 refills | Status: DC | PRN
Start: 2023-07-27 — End: 2024-09-25
  Filled 2023-07-27: qty 90, 45d supply, fill #0

## 2023-07-27 MED ORDER — COLCHICINE 0.6 MG PO TABS
ORAL_TABLET | ORAL | 99 refills | Status: DC
Start: 2023-07-27 — End: 2024-09-25
  Filled 2023-07-27: qty 30, 30d supply, fill #0
  Filled 2023-09-28: qty 30, 30d supply, fill #1
  Filled 2023-10-23: qty 90, 90d supply, fill #2

## 2023-07-27 MED ORDER — BUPROPION HCL ER (XL) 300 MG PO TB24
300.0000 mg | ORAL_TABLET | Freq: Every day | ORAL | 3 refills | Status: DC
Start: 2023-07-27 — End: 2024-09-25
  Filled 2023-07-27 – 2023-09-28 (×2): qty 90, 90d supply, fill #0

## 2023-07-27 MED ORDER — ALLOPURINOL 100 MG PO TABS
150.0000 mg | ORAL_TABLET | Freq: Every day | ORAL | 3 refills | Status: DC
  Filled 2023-07-27 – 2023-09-28 (×2): qty 135, 90d supply, fill #0
  Filled 2024-04-04: qty 135, 90d supply, fill #1

## 2023-07-27 NOTE — Progress Notes (Signed)
HUXON LEVIER is a 42 y.o. male presents to office today for annual physical exam examination.    Concerns today include: 1. none  Occupation: Medical illustrator, Marital status: married, Substance use: none Health Maintenance Due  Topic Date Due   HIV Screening  Never done   Diabetic kidney evaluation - Urine ACR  Never done   DTaP/Tdap/Td (1 - Tdap) Never done   Diabetic kidney evaluation - eGFR measurement  02/16/2023   Refills needed today: all  Immunization History  Administered Date(s) Administered   Influenza-Unspecified 09/25/2020   History reviewed. No pertinent past medical history. Social History   Socioeconomic History   Marital status: Single    Spouse name: Not on file   Number of children: Not on file   Years of education: Not on file   Highest education level: Not on file  Occupational History   Not on file  Tobacco Use   Smoking status: Never   Smokeless tobacco: Not on file  Substance and Sexual Activity   Alcohol use: No   Drug use: No   Sexual activity: Not on file  Other Topics Concern   Not on file  Social History Narrative   Not on file   Social Determinants of Health   Financial Resource Strain: Not on file  Food Insecurity: Not on file  Transportation Needs: Not on file  Physical Activity: Not on file  Stress: Not on file  Social Connections: Not on file  Intimate Partner Violence: Not on file   History reviewed. No pertinent surgical history. Family History  Problem Relation Age of Onset   Diabetes Mother     Current Outpatient Medications:    allopurinol (ZYLOPRIM) 100 MG tablet, Take 1 tablet (100 mg total) by mouth daily., Disp: 90 tablet, Rfl: 3   buPROPion (WELLBUTRIN XL) 300 MG 24 hr tablet, Take 1 tablet by mouth daily., Disp: 90 tablet, Rfl: 3   colchicine 0.6 MG tablet, Take 2 tablets by mouth at the first sign of a flare followed by 1 tablet 1 hour later, then take 1 tablet daily until the flare completely resolves, Disp:  14 tablet, Rfl: 0   Continuous Blood Gluc Receiver (DEXCOM G6 RECEIVER) DEVI, Use as directed for blood sugar checks, Disp: 1 each, Rfl: 0   Continuous Blood Gluc Sensor (DEXCOM G6 SENSOR) MISC, Use as directed for blood sugar checks, Disp: 6 each, Rfl: 12   Semaglutide,0.25 or 0.5MG /DOS, (OZEMPIC, 0.25 OR 0.5 MG/DOSE,) 2 MG/3ML SOPN, Inject 0.5 mg into the skin every 7 (seven) days., Disp: 9 mL, Rfl: 3  Allergies  Allergen Reactions   Penicillins     Has patient had a PCN reaction causing immediate rash, facial/tongue/throat swelling, SOB or lightheadedness with hypotension: Yes Has patient had a PCN reaction causing severe rash involving mucus membranes or skin necrosis: No Has patient had a PCN reaction that required hospitalization No Has patient had a PCN reaction occurring within the last 10 years: No If all of the above answers are "NO", then may proceed with Cephalosporin use.      ROS: Review of Systems A comprehensive review of systems was negative except for: Musculoskeletal: positive for occasional gout flare    Physical exam BP 122/80   Pulse 96   Temp 98.9 F (37.2 C)   Ht 5\' 9"  (1.753 m)   Wt 233 lb (105.7 kg)   SpO2 96%   BMI 34.41 kg/m  General appearance: alert, cooperative, appears stated age, no  distress, and mildly obese Head: Normocephalic, without obvious abnormality, atraumatic Eyes: negative findings: lids and lashes normal, conjunctivae and sclerae normal, corneas clear, and pupils equal, round, reactive to light and accomodation Ears: normal TM's and external ear canals both ears Nose: Nares normal. Septum midline. Mucosa normal. No drainage or sinus tenderness. Throat: lips, mucosa, and tongue normal; teeth and gums normal Neck: no adenopathy, no carotid bruit, supple, symmetrical, trachea midline, and thyroid not enlarged, symmetric, no tenderness/mass/nodules Back: symmetric, no curvature. ROM normal. No CVA tenderness. Lungs: clear to auscultation  bilaterally Chest wall: no tenderness Heart: regular rate and rhythm, S1, S2 normal, no murmur, click, rub or gallop Abdomen: soft, non-tender; bowel sounds normal; no masses,  no organomegaly Extremities: extremities normal, atraumatic, no cyanosis or edema Pulses: 2+ and symmetric Skin: Skin color, texture, turgor normal. No rashes or lesions Lymph nodes: Cervical, supraclavicular, and axillary nodes normal. Neurologic: Grossly normal      07/27/2023   11:05 AM 08/11/2022    9:53 AM 02/16/2022   11:32 AM  Depression screen PHQ 2/9  Decreased Interest 0 0 0  Down, Depressed, Hopeless 0 0 0  PHQ - 2 Score 0 0 0  Altered sleeping 1 2 3   Tired, decreased energy 1 2 1   Change in appetite 1 0 1  Feeling bad or failure about yourself  0 0 0  Trouble concentrating 0 0 0  Moving slowly or fidgety/restless 0 0 0  Suicidal thoughts 0 0 0  PHQ-9 Score 3 4 5   Difficult doing work/chores Not difficult at all Somewhat difficult Somewhat difficult      07/27/2023   10:59 AM 08/11/2022    9:54 AM 02/16/2022   11:32 AM 10/19/2021    4:18 PM  GAD 7 : Generalized Anxiety Score  Nervous, Anxious, on Edge 0 1 3 0  Control/stop worrying 0 1 3 1   Worry too much - different things 0 1 3 1   Trouble relaxing 0 0 2 1  Restless 0 0 0 0  Easily annoyed or irritable 0 2 3 1   Afraid - awful might happen 0 0 0 0  Total GAD 7 Score 0 5 14 4   Anxiety Difficulty Not difficult at all Somewhat difficult Not difficult at all Somewhat difficult     Assessment/ Plan: [name] here for annual physical exam.   Annual physical exam  Controlled type 2 diabetes mellitus without complication, without long-term current use of insulin (HCC) - Plan: CMP14+EGFR, Bayer DCA Hb A1c Waived, Microalbumin / creatinine urine ratio, Semaglutide,0.25 or 0.5MG /DOS, (OZEMPIC, 0.25 OR 0.5 MG/DOSE,) 2 MG/3ML SOPN  Hyperlipidemia associated with type 2 diabetes mellitus (HCC) - Plan: CMP14+EGFR  Idiopathic chronic gout of  right ankle without tophus - Plan: CMP14+EGFR, CBC, allopurinol (ZYLOPRIM) 100 MG tablet, colchicine 0.6 MG tablet, Uric Acid  Screening for HIV (human immunodeficiency virus) - Plan: HIV Antibody (routine testing w rflx)  Encounter for hepatitis C screening test for low risk patient - Plan: Hepatitis C Antibody  Screening for malignant neoplasm of prostate - Plan: PSA  Stress-related symptoms - Plan: buPROPion (WELLBUTRIN XL) 300 MG 24 hr tablet  Disordered sleep - Plan: traZODone (DESYREL) 50 MG tablet  Tetanus shot administered.  Sugar remains under excellent control.  Continue GLP.  Check urine microalbumin.  Check renal function, liver enzymes  Patient nonfasting so lipid was not collected today.  Continue statin  Check uric acid given history of gout with flare x 2 over the last year.  Of advance his allopurinol to 150 mg daily  Screening for HIV, hepatitis C, prostate ordered  Wellbutrin renewed and trazodone reduced to 50 mg as 100 mg was too strong for him  Counseled on healthy lifestyle choices, including diet (rich in fruits, vegetables and lean meats and low in salt and simple carbohydrates) and exercise (at least 30 minutes of moderate physical activity daily).  Patient to follow up 16m for DM/ Lipid/ foot exam  Raeqwon Lux M. Nadine Counts, DO

## 2023-08-15 ENCOUNTER — Encounter: Payer: Self-pay | Admitting: Family Medicine

## 2023-08-15 DIAGNOSIS — E119 Type 2 diabetes mellitus without complications: Secondary | ICD-10-CM

## 2023-08-16 ENCOUNTER — Other Ambulatory Visit (HOSPITAL_COMMUNITY): Payer: Self-pay

## 2023-08-16 MED ORDER — SEMAGLUTIDE (1 MG/DOSE) 4 MG/3ML ~~LOC~~ SOPN
1.0000 mg | PEN_INJECTOR | SUBCUTANEOUS | 3 refills | Status: DC
Start: 2023-08-16 — End: 2024-09-25
  Filled 2023-08-16 – 2023-09-09 (×3): qty 9, 84d supply, fill #0
  Filled 2023-09-12: qty 3, 28d supply, fill #0
  Filled 2023-10-06: qty 3, 28d supply, fill #1
  Filled 2023-11-16: qty 9, 84d supply, fill #2
  Filled 2024-02-14: qty 9, 84d supply, fill #3
  Filled 2024-05-27 – 2024-06-05 (×2): qty 9, 84d supply, fill #4

## 2023-08-17 ENCOUNTER — Other Ambulatory Visit (HOSPITAL_COMMUNITY): Payer: Self-pay

## 2023-08-19 ENCOUNTER — Other Ambulatory Visit: Payer: 59

## 2023-08-19 DIAGNOSIS — Z0189 Encounter for other specified special examinations: Secondary | ICD-10-CM

## 2023-08-21 LAB — TESTOSTERONE,FREE AND TOTAL
Testosterone, Free: 11.4 pg/mL (ref 6.8–21.5)
Testosterone: 523 ng/dL (ref 264–916)

## 2023-09-09 ENCOUNTER — Telehealth: Payer: Self-pay | Admitting: Family Medicine

## 2023-09-09 ENCOUNTER — Other Ambulatory Visit (HOSPITAL_COMMUNITY): Payer: Self-pay

## 2023-09-10 ENCOUNTER — Telehealth: Payer: 59 | Admitting: Nurse Practitioner

## 2023-09-10 ENCOUNTER — Other Ambulatory Visit (HOSPITAL_COMMUNITY): Payer: Self-pay

## 2023-09-10 ENCOUNTER — Encounter: Payer: Self-pay | Admitting: Family Medicine

## 2023-09-10 DIAGNOSIS — L237 Allergic contact dermatitis due to plants, except food: Secondary | ICD-10-CM | POA: Diagnosis not present

## 2023-09-10 MED ORDER — PREDNISONE 10 MG PO TABS
ORAL_TABLET | ORAL | 0 refills | Status: DC
Start: 2023-09-10 — End: 2024-09-25
  Filled 2023-09-10 – 2023-09-14 (×3): qty 21, 6d supply, fill #0

## 2023-09-10 NOTE — Progress Notes (Signed)
E-Visit for [employer]  We are sorry that you are not feeing well.  Here is how we plan to help!  Based on what you have shared with me it looks like you have had an allergic reaction to the oily resin from a group of plants.  This resin is very sticky, so it easily attaches to your skin, clothing, tools equipment, and pet's fur.    This blistering rash is often called poison ivy rash although it can come from contact with the leaves, stems and roots of poison ivy, poison oak and poison sumac.  The oily resin contains urushiol (u-ROO-she-ol) that produces a skin rash on exposed skin.  The severity of the rash depends on the amount of urushiol that gets on your skin.  A section of skin with more urushiol on it may develop a rash sooner.  The rash usually develops 12-48 hours after exposure and can last two to three weeks.  Your skin must come in direct contact with the plant's oil to be affected.  Blister fluid doesn't spread the rash.  However, if you come into contact with a piece of clothing or pet fur that has urushiol on it, the rash may spread out.  You can also transfer the oil to other parts of your body with your fingers.  Often the rash looks like a straight line because of the way the plant brushes against your skin.  Since your rash is widespread or has resulted in a large number of blisters, I have prescribed an oral corticosteroid.  Please follow these recommendations:  I have sent a prednisone dose pack to your chosen pharmacy. Be sure to follow the instructions carefully and complete the entire prescription. You may use Benadryl or Caladryl topical lotions to sooth the itch and remember cool, not hot, showers and baths can help relieve the itching!  Place cool, wet compresses on the affected area for 15-30 minutes several times a day.  You may also take oral antihistamines, such as diphenhydramine (Benadryl, others), which may also help you sleep better.  Watch your skin for any purulent  (pus) drainage or red streaking from the site.  If this occurs, contact your provider.  You may require an antibiotic for a skin infection.  Make sure that the clothes you were wearing as well as any towels or sheets that may have come in contact with the oil (urushiol) are washed in detergent and hot water.       I have developed the following plan to treat your condition I am prescribing a 6 day steroid taper. Please read instructions on label.   What can you do to prevent this rash?  Avoid the plants.  Learn how to identify poison ivy, poison oak and poison sumac in all seasons.  When hiking or engaging in other activities that might expose you to these plants, try to stay on cleared pathways.  If camping, make sure you pitch your tent in an area free of these plants.  Keep pets from running through wooded areas so that urushiol doesn't accidentally stick to their fur, which you may touch.  Remove or kill the plants.  In your yard, you can get rid of poison ivy by applying an herbicide or pulling it out of the ground, including the roots, while wearing heavy gloves.  Afterward remove the gloves and thoroughly wash them and your hands.  Don't burn poison ivy or related plants because the urushiol can be carried by smoke.  Wear protective clothing.  If needed, protect your skin by wearing socks, boots, pants, long sleeves and vinyl gloves.  Wash your skin right away.  Washing off the oil with soap and water within 30 minutes of exposure may reduce your chances of getting a poison ivy rash.  Even washing after an hour or so can help reduce the severity of the rash.  If you walk through some poison ivy and then later touch your shoes, you may get some urushiol on your hands, which may then transfer to your face or body by touching or rubbing.  If the contaminated object isn't cleaned, the urushiol on it can still cause a skin reaction years later.    Be careful not to reuse towels after you have  washed your skin.  Also carefully wash clothing in detergent and hot water to remove all traces of the oil.  Handle contaminated clothing carefully so you don't transfer the urushiol to yourself, furniture, rugs or appliances.  Remember that pets can carry the oil on their fur and paws.  If you think your pet may be contaminated with urushiol, put on some long rubber gloves and give your pet a bath.  Finally, be careful not to burn these plants as the smoke can contain traces of the oil.  Inhaling the smoke may result in difficulty breathing. If that occurred you should see a physician as soon as possible.  See your doctor right away if:  The reaction is severe or widespread You inhaled the smoke from burning poison ivy and are having difficulty breathing Your skin continues to swell The rash affects your eyes, mouth or genitals Blisters are oozing pus You develop a fever greater than 100 F (37.8 C) The rash doesn't get better within a few weeks.  If you scratch the poison ivy rash, bacteria under your fingernails may cause the skin to become infected.  See your doctor if pus starts oozing from the blisters.  Treatment generally includes antibiotics.  Poison ivy treatments are usually limited to self-care methods.  And the rash typically goes away on its own in two to three weeks.     If the rash is widespread or results in a large number of blisters, your doctor may prescribe an oral corticosteroid, such as prednisone.  If a bacterial infection has developed at the rash site, your doctor may give you a prescription for an oral antibiotic.  MAKE SURE YOU  Understand these instructions. Will watch your condition. Will get help right away if you are not doing well or get worse.   Thank you for choosing an e-visit.  Your e-visit answers were reviewed by a board certified advanced clinical practitioner to complete your personal care plan. Depending upon the condition, your plan could have  included both over the counter or prescription medications.  Please review your pharmacy choice. Make sure the pharmacy is open so you can pick up prescription now. If there is a problem, you may contact your provider through [employer] and have the prescription routed to another pharmacy.  Your safety is important to [place]. If you have drug allergies check your prescription carefully.   For the next 24 hours you can use MyChart to ask questions about today's visit, request a non-urgent call back, or ask for a work or school excuse. You will get an email in the next two days asking about your experience. I hope that your e-visit has been valuable and will speed your recovery.

## 2023-09-10 NOTE — Progress Notes (Signed)
I have spent 5 minutes in review of e-visit questionnaire, review and updating patient chart, medical decision making and response to patient.  ° °Jerrell Mangel W Secilia Apps, NP ° °  °

## 2023-09-12 ENCOUNTER — Other Ambulatory Visit: Payer: Self-pay

## 2023-09-12 ENCOUNTER — Other Ambulatory Visit (HOSPITAL_COMMUNITY): Payer: Self-pay

## 2023-09-12 ENCOUNTER — Telehealth: Payer: Self-pay | Admitting: Family Medicine

## 2023-09-12 NOTE — Telephone Encounter (Signed)
Please advise on new script from 07/27/23 OV

## 2023-09-12 NOTE — Telephone Encounter (Signed)
  Prescription Request  09/12/2023  Is this a "Controlled Substance" medicine? NO  Have you seen your PCP in the last 2 weeks? 07/27/23   If YES, route message to pool  -  If NO, patient needs to be scheduled for appointment.  What is the name of the medication or equipment? OZEMPIC/ pt said that dr Nadine Counts doubled the dose and he has ran out. He said pharmacy told him it wasn't time to refill and he needs new rx called in.  Have you contacted your pharmacy to request a refill? yes   Which pharmacy would you like this sent to? [facility]   Patient notified that their request is being sent to the clinical staff for review and that they should receive a response within 2 business days.

## 2023-09-12 NOTE — Telephone Encounter (Signed)
This can wait for PCP.

## 2023-09-13 ENCOUNTER — Other Ambulatory Visit (HOSPITAL_COMMUNITY): Payer: Self-pay

## 2023-09-13 NOTE — Telephone Encounter (Signed)
I'm not sure why this is being sent to me?  Has anyone called pharmacy to check on receipt status?  I suspect it is a PA?

## 2023-09-13 NOTE — Telephone Encounter (Signed)
.  Pharmacy Patient Advocate Encounter   Received notification from Pt Calls Messages that prior authorization for Ozempic is required/requested.   Insurance verification completed.   The patient is insured through [facility] .   Per test claim: The current 28 day co-pay is, $24.99.  No PA needed at this time. This test claim was processed through [facility] ([facility])- copay amounts may vary at other pharmacies due to pharmacy/plan contracts, or as the patient moves through the different stages of their insurance plan.

## 2023-09-13 NOTE — Telephone Encounter (Signed)
LMTCB

## 2023-09-13 NOTE — Telephone Encounter (Signed)
This was sent 8/27. Can you check if there is a PA needed or something?

## 2023-09-14 ENCOUNTER — Other Ambulatory Visit: Payer: Self-pay

## 2023-09-15 ENCOUNTER — Other Ambulatory Visit (HOSPITAL_COMMUNITY): Payer: Self-pay

## 2023-09-15 ENCOUNTER — Other Ambulatory Visit: Payer: Self-pay

## 2023-09-15 NOTE — Telephone Encounter (Signed)
Per pharmacy, medication has already been filled and shipped to pt for $24.99, no PA required

## 2023-09-19 ENCOUNTER — Other Ambulatory Visit (HOSPITAL_COMMUNITY): Payer: Self-pay

## 2023-09-28 ENCOUNTER — Other Ambulatory Visit: Payer: Self-pay

## 2023-09-28 ENCOUNTER — Other Ambulatory Visit (HOSPITAL_COMMUNITY): Payer: Self-pay

## 2023-10-05 NOTE — Telephone Encounter (Signed)
Refer to other phone encounter about PA

## 2023-10-06 ENCOUNTER — Other Ambulatory Visit: Payer: Self-pay

## 2023-10-06 ENCOUNTER — Other Ambulatory Visit (HOSPITAL_COMMUNITY): Payer: Self-pay

## 2023-10-23 ENCOUNTER — Other Ambulatory Visit: Payer: Self-pay | Admitting: Nurse Practitioner

## 2023-10-23 DIAGNOSIS — L237 Allergic contact dermatitis due to plants, except food: Secondary | ICD-10-CM

## 2023-10-24 ENCOUNTER — Other Ambulatory Visit: Payer: Self-pay

## 2023-10-24 ENCOUNTER — Other Ambulatory Visit (HOSPITAL_COMMUNITY): Payer: Self-pay

## 2023-10-25 ENCOUNTER — Other Ambulatory Visit: Payer: Self-pay

## 2023-10-28 ENCOUNTER — Encounter (HOSPITAL_COMMUNITY): Payer: Self-pay

## 2023-10-28 ENCOUNTER — Other Ambulatory Visit: Payer: Self-pay

## 2023-10-28 ENCOUNTER — Emergency Department (HOSPITAL_COMMUNITY): Payer: 59

## 2023-10-28 ENCOUNTER — Emergency Department (HOSPITAL_COMMUNITY)
Admission: EM | Admit: 2023-10-28 | Discharge: 2023-10-29 | Disposition: A | Discharge status: Home / Self Care | Payer: 59 | Attending: Emergency Medicine | Admitting: Emergency Medicine

## 2023-10-28 DIAGNOSIS — S60551A Superficial foreign body of right hand, initial encounter: Secondary | ICD-10-CM | POA: Diagnosis not present

## 2023-10-28 DIAGNOSIS — W458XXA Other foreign body or object entering through skin, initial encounter: Secondary | ICD-10-CM | POA: Insufficient documentation

## 2023-10-28 DIAGNOSIS — Z0389 Encounter for observation for other suspected diseases and conditions ruled out: Secondary | ICD-10-CM | POA: Diagnosis not present

## 2023-10-28 MED ORDER — LIDOCAINE-EPINEPHRINE (PF) 2 %-1:200000 IJ SOLN
10.0000 mL | Freq: Once | INTRAMUSCULAR | Status: AC
  Administered 2023-10-28: 10 mL
  Filled 2023-10-28: qty 20

## 2023-10-28 NOTE — ED Provider Notes (Signed)
[place] EMERGENCY DEPARTMENT AT [facility]  Provider Note  CSN: [id number] Arrival date & time: 10/28/23 2149  History Chief Complaint  Patient presents with   Foreign Body    Stephen Johnson is a 42 y.o. male reports he was working on his hardwood floors a few hours ago when he got a splinter stuck in the palm of his right hand. He was unable to extract it himself. TDAP is UTD.    Home Medications Prior to Admission medications   Medication Sig Start Date End Date Taking? Authorizing Provider  doxycycline (VIBRAMYCIN) 100 MG capsule Take 1 capsule (100 mg total) by mouth 2 (two) times daily. 10/29/23  Yes Pollyann Savoy, MD  allopurinol (ZYLOPRIM) 100 MG tablet Take 1.5 tablets (150 mg total) by mouth daily. *dose increase 07/27/23   Delynn Flavin M, DO  buPROPion (WELLBUTRIN XL) 300 MG 24 hr tablet Take 1 tablet (300 mg total) by mouth daily. 07/27/23   Raliegh Ip, DO  colchicine 0.6 MG tablet Take 2 tablets by mouth at the first sign of a flare followed by 1 tablet 1 hour later, then take 1 tablet daily until the flare completely resolves (hold statin while taking) 07/27/23   Delynn Flavin M, DO  predniSONE (DELTASONE) 10 MG tablet Day 1: 2 tablets before breakfast, 1 after both lunch & dinner, 2 at bedtime-- Day 2: 1 tab before breakfast, 1 after both lunch & dinner, 2 at bedtime-- Day 3: 1 tab at each meal & 1 at bedtime Day 4: 1 tab at breakfast, 1 at lunch, 1 at bedtime Day 5: 1 tab at breakfast & 1 tab at bedtime Day 6: 1 tab at breakfast 09/10/23   Claiborne Rigg, NP  Semaglutide, 1 MG/DOSE, 4 MG/3ML SOPN Inject 1 mg as directed once a week. 08/16/23   Raliegh Ip, DO  traZODone (DESYREL) 50 MG tablet Take 0.5-2 tablets (25-100 mg total) by mouth at bedtime as needed for sleep. 07/27/23   Raliegh Ip, DO     Allergies    Penicillins   Review of Systems   Review of Systems Please see HPI for pertinent positives and  negatives  Physical Exam BP 118/85 (BP Location: Right Arm)   Pulse 90   Temp 97.9 F (36.6 C) (Oral)   Resp 18   Ht 5\' 9"  (1.753 m)   Wt 104.3 kg   SpO2 99%   BMI 33.97 kg/m   Physical Exam Vitals and nursing note reviewed.  HENT:     Head: Normocephalic.     Nose: Nose normal.  Eyes:     Extraocular Movements: Extraocular movements intact.  Pulmonary:     Effort: Pulmonary effort is normal.  Musculoskeletal:        General: Normal range of motion.     Cervical back: Neck supple.     Comments: Puncture wound on ulnar palmar surface of R hand  Skin:    Findings: No rash (on exposed skin).  Neurological:     Mental Status: He is alert and oriented to person, place, and time.  Psychiatric:        Mood and Affect: Mood normal.     ED Results / Procedures / Treatments   EKG None  Procedures .Foreign Body Removal  Date/Time: 10/29/2023 12:20 AM  Performed by: Pollyann Savoy, MD Authorized by: Pollyann Savoy, MD  Consent: Verbal consent obtained. Body area: skin General location: upper extremity Location details:  right hand Anesthesia: local infiltration  Anesthesia: Local Anesthetic: lidocaine 2% with epinephrine  Sedation: Patient sedated: no  Patient restrained: no Localization method: probed (small incision with #11 blade; FB no visualized) Removal mechanism: forceps Dressing: dressing applied Tendon involvement: none Depth: deep Complexity: simple Post-procedure assessment: foreign body not removed    Medications Ordered in the ED Medications  doxycycline (VIBRA-TABS) tablet 100 mg (has no administration in time range)  lidocaine-EPINEPHrine (XYLOCAINE W/EPI) 2 %-1:200000 (PF) injection 10 mL (10 mLs Infiltration Given 10/28/23 2357)    Initial Impression and Plan  Patient here with FB in R hand, I personally viewed the images from radiology studies and awaiting radiologist interpretation: Xrays I neg for apparent FB. As above, I was  unable to locate the FB even after making a small incision. Will plan oral Abx and referral to Hand. RTED if pain increases, signs of infection or other concerns.   ED Course       MDM Rules/Calculators/A&P Medical Decision Making Problems Addressed: Foreign body of skin of right hand: acute illness or injury  Amount and/or Complexity of Data Reviewed Radiology: ordered and independent interpretation performed. Decision-making details documented in ED Course.  Risk Prescription drug management.     Final Clinical Impression(s) / ED Diagnoses Final diagnoses:  Foreign body of skin of right hand    Rx / DC Orders ED Discharge Orders          Ordered    doxycycline (VIBRAMYCIN) 100 MG capsule  2 times daily        10/29/23 0020             Pollyann Savoy, MD 10/29/23 [phone]

## 2023-10-28 NOTE — ED Provider Notes (Incomplete)
[place] EMERGENCY DEPARTMENT AT [facility]  Provider Note  CSN: [id number] Arrival date & time: 10/28/23 2149  History Chief Complaint  Patient presents with  . Foreign Body    Stephen Johnson is a 42 y.o. male reports he was working on his hardwood floors a few hours ago when he got a splinter stuck in the palm of his right hand. He was unable to extract it himself. TDAP is UTD.    Home Medications Prior to Admission medications   Medication Sig Start Date End Date Taking? Authorizing Provider  allopurinol (ZYLOPRIM) 100 MG tablet Take 1.5 tablets (150 mg total) by mouth daily. *dose increase 07/27/23   Delynn Flavin M, DO  buPROPion (WELLBUTRIN XL) 300 MG 24 hr tablet Take 1 tablet (300 mg total) by mouth daily. 07/27/23   Raliegh Ip, DO  colchicine 0.6 MG tablet Take 2 tablets by mouth at the first sign of a flare followed by 1 tablet 1 hour later, then take 1 tablet daily until the flare completely resolves (hold statin while taking) 07/27/23   Delynn Flavin M, DO  predniSONE (DELTASONE) 10 MG tablet Day 1: 2 tablets before breakfast, 1 after both lunch & dinner, 2 at bedtime-- Day 2: 1 tab before breakfast, 1 after both lunch & dinner, 2 at bedtime-- Day 3: 1 tab at each meal & 1 at bedtime Day 4: 1 tab at breakfast, 1 at lunch, 1 at bedtime Day 5: 1 tab at breakfast & 1 tab at bedtime Day 6: 1 tab at breakfast 09/10/23   Claiborne Rigg, NP  Semaglutide, 1 MG/DOSE, 4 MG/3ML SOPN Inject 1 mg as directed once a week. 08/16/23   Raliegh Ip, DO  traZODone (DESYREL) 50 MG tablet Take 0.5-2 tablets (25-100 mg total) by mouth at bedtime as needed for sleep. 07/27/23   Raliegh Ip, DO     Allergies    Penicillins   Review of Systems   Review of Systems Please see HPI for pertinent positives and negatives  Physical Exam BP 118/85 (BP Location: Right Arm)   Pulse 90   Temp 97.9 F (36.6 C) (Oral)   Resp 18   Ht 5\' 9"  (1.753 m)   Wt 104.3  kg   SpO2 99%   BMI 33.97 kg/m   Physical Exam Vitals and nursing note reviewed.  HENT:     Head: Normocephalic.     Nose: Nose normal.  Eyes:     Extraocular Movements: Extraocular movements intact.  Pulmonary:     Effort: Pulmonary effort is normal.  Musculoskeletal:        General: Normal range of motion.     Cervical back: Neck supple.     Comments: Puncture wound on ulnar palmar surface of R hand  Skin:    Findings: No rash (on exposed skin).  Neurological:     Mental Status: He is alert and oriented to person, place, and time.  Psychiatric:        Mood and Affect: Mood normal.     ED Results / Procedures / Treatments   EKG None  Procedures Procedures  Medications Ordered in the ED Medications  lidocaine-EPINEPHrine (XYLOCAINE W/EPI) 2 %-1:200000 (PF) injection 10 mL (has no administration in time range)    Initial Impression and Plan  Patient here with FB in R hand, I personally viewed the images from radiology studies and awaiting radiologist interpretation: Xrays I neg for apparent FB  ED Course  MDM Rules/Calculators/A&P Medical Decision Making Amount and/or Complexity of Data Reviewed Radiology: ordered.  Risk Prescription drug management.     Final Clinical Impression(s) / ED Diagnoses Final diagnoses:  None    Rx / DC Orders ED Discharge Orders     None

## 2023-10-28 NOTE — ED Triage Notes (Signed)
Splinter in hand Happened around 18:00 Wood Pt stated it is roughly an inch in size

## 2023-10-29 MED ORDER — DOXYCYCLINE HYCLATE 100 MG PO CAPS
100.0000 mg | ORAL_CAPSULE | Freq: Two times a day (BID) | ORAL | 0 refills | Status: DC
Start: 2023-10-29 — End: 2024-09-25

## 2023-10-29 MED ORDER — DOXYCYCLINE HYCLATE 100 MG PO TABS
100.0000 mg | ORAL_TABLET | Freq: Once | ORAL | Status: AC
  Administered 2023-10-29: 100 mg via ORAL
  Filled 2023-10-29: qty 1

## 2023-11-02 DIAGNOSIS — M79641 Pain in right hand: Secondary | ICD-10-CM | POA: Diagnosis not present

## 2023-11-02 DIAGNOSIS — S60551A Superficial foreign body of right hand, initial encounter: Secondary | ICD-10-CM | POA: Diagnosis not present

## 2023-11-03 DIAGNOSIS — S60551A Superficial foreign body of right hand, initial encounter: Secondary | ICD-10-CM | POA: Diagnosis not present

## 2023-11-09 ENCOUNTER — Other Ambulatory Visit (HOSPITAL_COMMUNITY): Payer: Self-pay

## 2023-11-16 ENCOUNTER — Other Ambulatory Visit (HOSPITAL_COMMUNITY): Payer: Self-pay

## 2024-02-14 ENCOUNTER — Other Ambulatory Visit (HOSPITAL_COMMUNITY): Payer: Self-pay

## 2024-04-04 ENCOUNTER — Telehealth: Admitting: Physician Assistant

## 2024-04-04 ENCOUNTER — Other Ambulatory Visit (HOSPITAL_COMMUNITY): Payer: Self-pay

## 2024-04-04 NOTE — Progress Notes (Unsigned)
 Message sent to patient requesting further input regarding current symptoms. Awaiting patient response.

## 2024-05-28 ENCOUNTER — Other Ambulatory Visit (HOSPITAL_COMMUNITY): Payer: Self-pay

## 2024-05-29 ENCOUNTER — Other Ambulatory Visit: Payer: Self-pay

## 2024-05-29 ENCOUNTER — Encounter: Payer: Self-pay | Admitting: Pharmacist

## 2024-06-01 ENCOUNTER — Other Ambulatory Visit: Payer: Self-pay

## 2024-06-05 ENCOUNTER — Other Ambulatory Visit: Payer: Self-pay

## 2024-06-05 ENCOUNTER — Other Ambulatory Visit (HOSPITAL_COMMUNITY): Payer: Self-pay

## 2024-09-03 ENCOUNTER — Encounter (HOSPITAL_COMMUNITY): Payer: Self-pay

## 2024-09-03 ENCOUNTER — Encounter: Payer: Self-pay | Admitting: Family Medicine

## 2024-09-03 ENCOUNTER — Other Ambulatory Visit: Payer: Self-pay | Admitting: Family Medicine

## 2024-09-03 ENCOUNTER — Other Ambulatory Visit (HOSPITAL_COMMUNITY): Payer: Self-pay

## 2024-09-03 DIAGNOSIS — E119 Type 2 diabetes mellitus without complications: Secondary | ICD-10-CM

## 2024-09-03 DIAGNOSIS — M1A0710 Idiopathic chronic gout, right ankle and foot, without tophus (tophi): Secondary | ICD-10-CM

## 2024-09-03 DIAGNOSIS — F439 Reaction to severe stress, unspecified: Secondary | ICD-10-CM

## 2024-09-03 NOTE — Telephone Encounter (Signed)
 [facility] last OV 07/27/23 NO RF sent to pharmacy last OV greater than a year

## 2024-09-03 NOTE — Telephone Encounter (Signed)
 Letter sent

## 2024-09-21 ENCOUNTER — Ambulatory Visit: Admitting: Family Medicine

## 2024-09-25 ENCOUNTER — Other Ambulatory Visit (HOSPITAL_COMMUNITY): Payer: Self-pay

## 2024-09-25 ENCOUNTER — Ambulatory Visit: Admitting: Family Medicine

## 2024-09-25 ENCOUNTER — Other Ambulatory Visit: Payer: Self-pay

## 2024-09-25 ENCOUNTER — Encounter: Payer: Self-pay | Admitting: Family Medicine

## 2024-09-25 VITALS — BP 120/84 | HR 92 | Temp 97.8°F | Ht 69.0 in | Wt 244.2 lb

## 2024-09-25 DIAGNOSIS — E785 Hyperlipidemia, unspecified: Secondary | ICD-10-CM

## 2024-09-25 DIAGNOSIS — F439 Reaction to severe stress, unspecified: Secondary | ICD-10-CM

## 2024-09-25 DIAGNOSIS — G479 Sleep disorder, unspecified: Secondary | ICD-10-CM | POA: Diagnosis not present

## 2024-09-25 DIAGNOSIS — Z125 Encounter for screening for malignant neoplasm of prostate: Secondary | ICD-10-CM

## 2024-09-25 DIAGNOSIS — E1169 Type 2 diabetes mellitus with other specified complication: Secondary | ICD-10-CM

## 2024-09-25 DIAGNOSIS — E119 Type 2 diabetes mellitus without complications: Secondary | ICD-10-CM | POA: Insufficient documentation

## 2024-09-25 DIAGNOSIS — M1A0710 Idiopathic chronic gout, right ankle and foot, without tophus (tophi): Secondary | ICD-10-CM

## 2024-09-25 DIAGNOSIS — Z0001 Encounter for general adult medical examination with abnormal findings: Secondary | ICD-10-CM | POA: Diagnosis not present

## 2024-09-25 DIAGNOSIS — Z7985 Long-term (current) use of injectable non-insulin antidiabetic drugs: Secondary | ICD-10-CM | POA: Diagnosis not present

## 2024-09-25 DIAGNOSIS — Z0000 Encounter for general adult medical examination without abnormal findings: Secondary | ICD-10-CM

## 2024-09-25 LAB — BAYER DCA HB A1C WAIVED: HB A1C (BAYER DCA - WAIVED): 4.9 % (ref 4.8–5.6)

## 2024-09-25 LAB — LIPID PANEL

## 2024-09-25 MED ORDER — SEMAGLUTIDE (2 MG/DOSE) 8 MG/3ML ~~LOC~~ SOPN
2.0000 mg | PEN_INJECTOR | SUBCUTANEOUS | 4 refills | Status: AC
  Filled 2024-09-25: qty 3, 28d supply, fill #0
  Filled 2024-10-18: qty 3, 28d supply, fill #1
  Filled 2024-11-16: qty 3, 28d supply, fill #2
  Filled 2024-12-14: qty 3, 28d supply, fill #3

## 2024-09-25 MED ORDER — DOXEPIN HCL 10 MG PO CAPS
10.0000 mg | ORAL_CAPSULE | Freq: Every evening | ORAL | 4 refills | Status: AC | PRN
  Filled 2024-09-25: qty 90, 90d supply, fill #0

## 2024-09-25 MED ORDER — BUPROPION HCL ER (XL) 300 MG PO TB24
300.0000 mg | ORAL_TABLET | Freq: Every day | ORAL | 3 refills | Status: AC
  Filled 2024-09-25: qty 90, 90d supply, fill #0
  Filled 2025-01-10: qty 90, 90d supply, fill #1

## 2024-09-25 MED ORDER — ALLOPURINOL 100 MG PO TABS
150.0000 mg | ORAL_TABLET | Freq: Every day | ORAL | 4 refills | Status: AC
  Filled 2024-09-25: qty 135, 90d supply, fill #0

## 2024-09-25 MED ORDER — COLCHICINE 0.6 MG PO TABS
ORAL_TABLET | ORAL | 99 refills | Status: AC
  Filled 2024-09-25: qty 30, 27d supply, fill #0

## 2024-09-25 NOTE — Progress Notes (Signed)
 Stephen Johnson is a 43 y.o. male presents to office today for annual physical exam examination.     Type 2 Diabetes with hyperlipidemia:  Compliant with all medications.  He is not treated with a statin.  Admits that he has not been following a strict diet and enjoys sweets.  He subsequently has had some weight gain.  Denies any GI side effects associates  Last eye exam: UTD Last foot exam: UTD Last A1c:  Lab Results  Component Value Date   HGBA1C 4.6 (L) 07/27/2023   Nephropathy screen indicated?: needs Last flu, zoster and/or pneumovax:  Immunization History  Administered Date(s) Administered   Influenza-Unspecified 09/25/2020   Tdap 07/27/2023    ROS: denies dizziness, LOC, polyuria, polydipsia, unintended weight loss/gain, foot ulcerations, numbness or tingling in extremities, shortness of breath or chest pain.  Stress, sleep disorder Patient reports trazodone  did work to help keep him asleep but he often felt hung over the following day so he discontinued it.  He had come off of Wellbutrin  because he thought maybe that was causing the excessive sleepiness during the daytime but his wife wants him to go back on it as she feels like he would benefit from it from a mood standpoint.  He would be willing to go on something that was not controlled if there was an alternative to the trazodone .  Denies any snoring or apneic spells during sleep.  Not feeling foggy headed during the daytime.  Continues to work 7-7  Occupation: Emergency services, Marital status: Married, Substance use: None Health Maintenance Due  Topic Date Due   Diabetic kidney evaluation - eGFR measurement  07/26/2024   Diabetic kidney evaluation - Urine ACR  07/26/2024    Immunization History  Administered Date(s) Administered   Influenza-Unspecified 09/25/2020   Tdap 07/27/2023   History reviewed. No pertinent past medical history. Social History   Socioeconomic History   Marital status: Single     Spouse name: Not on file   Number of children: Not on file   Years of education: Not on file   Highest education level: Not on file  Occupational History   Not on file  Tobacco Use   Smoking status: Never   Smokeless tobacco: Not on file  Substance and Sexual Activity   Alcohol use: No   Drug use: No   Sexual activity: Not on file  Other Topics Concern   Not on file  Social History Narrative   Not on file   Social Drivers of Health   Financial Resource Strain: Not on file  Food Insecurity: Not on file  Transportation Needs: Not on file  Physical Activity: Not on file  Stress: Not on file  Social Connections: Not on file  Intimate Partner Violence: Not on file   History reviewed. No pertinent surgical history. Family History  Problem Relation Age of Onset   Diabetes Mother     Current Outpatient Medications:    Semaglutide , 2 MG/DOSE, 8 MG/3ML SOPN, Inject 2 mg as directed once a week., Disp: 9 mL, Rfl: 4   allopurinol  (ZYLOPRIM ) 100 MG tablet, Take 1.5 tablets (150 mg total) by mouth daily., Disp: 135 tablet, Rfl: 4   buPROPion  (WELLBUTRIN  XL) 300 MG 24 hr tablet, Take 1 tablet (300 mg total) by mouth daily., Disp: 90 tablet, Rfl: 3   colchicine  0.6 MG tablet, Take 2 tablets by mouth at the first sign of a flare followed by 1 tablet 1 hour later, then take 1 tablet  daily until the flare completely resolves (hold statin while taking), Disp: 30 tablet, Rfl: PRN  Allergies  Allergen Reactions   Penicillins     Has patient had a PCN reaction causing immediate rash, facial/tongue/throat swelling, SOB or lightheadedness with hypotension: Yes Has patient had a PCN reaction causing severe rash involving mucus membranes or skin necrosis: No Has patient had a PCN reaction that required hospitalization No Has patient had a PCN reaction occurring within the last 10 years: No If all of the above answers are NO, then may proceed with Cephalosporin use.      ROS: Review of  Systems Pertinent items noted in HPI and remainder of comprehensive ROS otherwise negative.    Physical exam BP 120/84   Pulse 92   Temp 97.8 F (36.6 C)   Ht 5' 9 (1.753 m)   Wt 244 lb 4 oz (110.8 kg)   SpO2 95%   BMI 36.07 kg/m  General appearance: alert, cooperative, appears stated age, no distress, and moderately obese Head: Normocephalic, without obvious abnormality, atraumatic Eyes: negative findings: lids and lashes normal, conjunctivae and sclerae normal, corneas clear, and pupils equal, round, reactive to light and accomodation Ears: normal TM's and external ear canals both ears Nose: Nares normal. Septum midline. Mucosa normal. No drainage or sinus tenderness. Throat: lips, mucosa, and tongue normal; teeth and gums normal Neck: no adenopathy, no carotid bruit, supple, symmetrical, trachea midline, and thyroid not enlarged, symmetric, no tenderness/mass/nodules Back: symmetric, no curvature. ROM normal. No CVA tenderness. Lungs: clear to auscultation bilaterally Chest wall: no tenderness Heart: regular rate and rhythm, S1, S2 normal, no murmur, click, rub or gallop Abdomen: soft, non-tender; bowel sounds normal; no masses,  no organomegaly and obese Extremities: extremities normal, atraumatic, no cyanosis or edema Pulses: 2+ and symmetric Skin: Skin color, texture, turgor normal. No rashes or lesions Lymph nodes: Cervical, supraclavicular, and axillary nodes normal. Neurologic: Alert and oriented X 3, normal strength and tone. Normal symmetric reflexes. Normal coordination and gait      09/25/2024    2:18 PM 07/27/2023   11:05 AM 08/11/2022    9:53 AM  Depression screen PHQ 2/9  Decreased Interest  0 0  Down, Depressed, Hopeless 0 0 0  PHQ - 2 Score 0 0 0  Altered sleeping 2 1 2   Tired, decreased energy 1 1 2   Change in appetite 1 1 0  Feeling bad or failure about yourself  0 0 0  Trouble concentrating 0 0 0  Moving slowly or fidgety/restless 0 0 0  Suicidal  thoughts 0 0 0  PHQ-9 Score 4 3 4   Difficult doing work/chores Not difficult at all Not difficult at all Somewhat difficult      09/25/2024    2:18 PM 07/27/2023   10:59 AM 08/11/2022    9:54 AM 02/16/2022   11:32 AM  GAD 7 : Generalized Anxiety Score  Nervous, Anxious, on Edge 0 0 1 3  Control/stop worrying 0 0 1 3  Worry too much - different things 0 0 1 3  Trouble relaxing 0 0 0 2  Restless 0 0 0 0  Easily annoyed or irritable 1 0 2 3  Afraid - awful might happen 0 0 0 0  Total GAD 7 Score 1 0 5 14  Anxiety Difficulty Not difficult at all Not difficult at all Somewhat difficult Not difficult at all    No results found for this or any previous visit (from the past 2160 hours).  Assessment/ Plan: Stephen Johnson here for annual physical exam.   Annual physical exam  Disordered sleep - Plan: doxepin (SINEQUAN) 10 MG capsule  Morbid obesity (HCC)  Diabetes mellitus treated with injections of non-insulin medication (HCC) - Plan: CMP14+EGFR, Bayer DCA Hb A1c Waived, Microalbumin / creatinine urine ratio, CBC with Differential, Semaglutide , 2 MG/DOSE, 8 MG/3ML SOPN  Hyperlipidemia associated with type 2 diabetes mellitus (HCC) - Plan: CMP14+EGFR, Lipid Panel, Semaglutide , 2 MG/DOSE, 8 MG/3ML SOPN  Idiopathic chronic gout of right ankle without tophus - Plan: CMP14+EGFR, Uric Acid, CBC with Differential, allopurinol  (ZYLOPRIM ) 100 MG tablet, colchicine  0.6 MG tablet  Stress-related symptoms - Plan: buPROPion  (WELLBUTRIN  XL) 300 MG 24 hr tablet  Screening for malignant neoplasm of prostate - Plan: PSA   Doxepin ordered.  I considered 3 or 6 mg but insurance does not cover.  Morbidly obese and would need to consider possible differential diagnosis as undiagnosed obstructive sleep apnea impairing sleep but he denied any daytime symptoms and there has been no observed sleep apneic spells or snoring.  We will advance his semaglutide  to 2 mg weekly to help control appetite and reduce  rebound of diabetic sugar levels.  A1c and nonfasting labs were collected along with urine microalbumin  Nonfasting lipid collected.  Not currently treated with a statin but should consider given age.  Check uric acid level.  Increased dose of allopurinol  seems to be controlling frequency and severity of gout flares.  Wellbutrin  readded.  Hopefully this will help curb some of the emotional eating as well  Check PSA.  Asymptomatic from a prostate standpoint  Counseled on healthy lifestyle choices, including diet (rich in fruits, vegetables and lean meats and low in salt and simple carbohydrates) and exercise (at least 30 minutes of moderate physical activity daily).  Patient to follow up 107m for DM  Cassidee Deats M. Jolinda, DO

## 2024-09-26 ENCOUNTER — Ambulatory Visit: Payer: Self-pay | Admitting: Family Medicine

## 2024-09-26 DIAGNOSIS — E1169 Type 2 diabetes mellitus with other specified complication: Secondary | ICD-10-CM

## 2024-09-26 LAB — CBC WITH DIFFERENTIAL/PLATELET
Basophils Absolute: 0 x10E3/uL (ref 0.0–0.2)
Basos: 1 %
EOS (ABSOLUTE): 0.3 x10E3/uL (ref 0.0–0.4)
Eos: 5 %
Hematocrit: 46.5 % (ref 37.5–51.0)
Hemoglobin: 15.6 g/dL (ref 13.0–17.7)
Immature Grans (Abs): 0 x10E3/uL (ref 0.0–0.1)
Immature Granulocytes: 0 %
Lymphocytes Absolute: 1.7 x10E3/uL (ref 0.7–3.1)
Lymphs: 26 %
MCH: 29.2 pg (ref 26.6–33.0)
MCHC: 33.5 g/dL (ref 31.5–35.7)
MCV: 87 fL (ref 79–97)
Monocytes Absolute: 0.6 x10E3/uL (ref 0.1–0.9)
Monocytes: 8 %
Neutrophils Absolute: 4 x10E3/uL (ref 1.4–7.0)
Neutrophils: 59 %
Platelets: 199 x10E3/uL (ref 150–450)
RBC: 5.35 x10E6/uL (ref 4.14–5.80)
RDW: 13.2 % (ref 11.6–15.4)
WBC: 6.6 x10E3/uL (ref 3.4–10.8)

## 2024-09-26 LAB — CMP14+EGFR
ALT: 26 IU/L (ref 0–44)
AST: 26 IU/L (ref 0–40)
Albumin: 4.8 g/dL (ref 4.1–5.1)
Alkaline Phosphatase: 114 IU/L (ref 47–123)
BUN/Creatinine Ratio: 13 (ref 9–20)
BUN: 16 mg/dL (ref 6–24)
Bilirubin Total: 0.4 mg/dL (ref 0.0–1.2)
CO2: 21 mmol/L (ref 20–29)
Calcium: 9.5 mg/dL (ref 8.7–10.2)
Chloride: 103 mmol/L (ref 96–106)
Creatinine, Ser: 1.25 mg/dL (ref 0.76–1.27)
Globulin, Total: 2.5 g/dL (ref 1.5–4.5)
Glucose: 103 mg/dL — AB (ref 70–99)
Potassium: 4.1 mmol/L (ref 3.5–5.2)
Sodium: 140 mmol/L (ref 134–144)
Total Protein: 7.3 g/dL (ref 6.0–8.5)
eGFR: 73 mL/min/1.73 (ref >59)

## 2024-09-26 LAB — LIPID PANEL
Cholesterol, Total: 193 mg/dL (ref 100–199)
HDL: 37 mg/dL — AB (ref >39)
LDL CALC COMMENT:: 5.2 ratio — AB (ref 0.0–5.0)
LDL Chol Calc (NIH): 122 mg/dL — AB (ref 0–99)
Triglycerides: 190 mg/dL — AB (ref 0–149)
VLDL Cholesterol Cal: 34 mg/dL (ref 5–40)

## 2024-09-26 LAB — MICROALBUMIN / CREATININE URINE RATIO
Creatinine, Urine: 71.6 mg/dL
Microalb/Creat Ratio: <4 mg/g{creat} (ref 0–29)
Microalbumin, Urine: <3 ug/mL

## 2024-09-26 LAB — PSA: Prostate Specific Ag, Serum: 1.3 ng/mL (ref 0.0–4.0)

## 2024-09-26 LAB — URIC ACID: Uric Acid: 7.7 mg/dL (ref 3.8–8.4)

## 2024-09-26 MED ORDER — ROSUVASTATIN CALCIUM 10 MG PO TABS: 10.0000 mg | ORAL_TABLET | Freq: Every day | ORAL | 3 refills | Status: AC

## 2024-10-18 ENCOUNTER — Other Ambulatory Visit: Payer: Self-pay

## 2024-11-12 ENCOUNTER — Telehealth: Payer: Self-pay

## 2024-11-12 NOTE — Telephone Encounter (Signed)
 Reached out to patient to inform him that his insurance company has reached out to our office about his diabetic eye exam. No answer no voicemail, phone kept ringing. MyChart message sent.

## 2024-11-16 ENCOUNTER — Other Ambulatory Visit (HOSPITAL_COMMUNITY): Payer: Self-pay

## 2024-12-05 ENCOUNTER — Encounter: Payer: Self-pay | Admitting: Family Medicine

## 2024-12-15 ENCOUNTER — Other Ambulatory Visit: Payer: Self-pay

## 2024-12-17 ENCOUNTER — Other Ambulatory Visit: Payer: Self-pay

## 2024-12-18 ENCOUNTER — Other Ambulatory Visit: Payer: Self-pay

## 2025-01-10 ENCOUNTER — Other Ambulatory Visit: Payer: Self-pay
